# Patient Record
Sex: Male | Born: 1960 | Race: White | Hispanic: No | Marital: Married | Smoking: Never smoker
Health system: Southern US, Community
[De-identification: ages and names within clinical notes are randomized; demographics above are authoritative.]

## PROBLEM LIST (undated history)

## (undated) DIAGNOSIS — F419 Anxiety disorder, unspecified: Secondary | ICD-10-CM

## (undated) DIAGNOSIS — R569 Unspecified convulsions: Secondary | ICD-10-CM

## (undated) DIAGNOSIS — I10 Essential (primary) hypertension: Secondary | ICD-10-CM

## (undated) DIAGNOSIS — M1990 Unspecified osteoarthritis, unspecified site: Secondary | ICD-10-CM

---

## 2000-10-10 ENCOUNTER — Encounter: Payer: Self-pay | Admitting: Emergency Medicine

## 2000-10-10 ENCOUNTER — Emergency Department (HOSPITAL_COMMUNITY): Admission: EM | Admit: 2000-10-10 | Discharge: 2000-10-10 | Payer: Self-pay | Admitting: Emergency Medicine

## 2010-03-17 ENCOUNTER — Emergency Department (HOSPITAL_COMMUNITY): Admission: EM | Admit: 2010-03-17 | Discharge: 2010-03-17 | Payer: Self-pay | Admitting: Emergency Medicine

## 2011-01-06 LAB — CBC
HCT: 44.5 % (ref 39.0–52.0)
Hemoglobin: 15.6 g/dL (ref 13.0–17.0)
MCHC: 35 g/dL (ref 30.0–36.0)
MCV: 96.2 fL (ref 78.0–100.0)
Platelets: 214 10*3/uL (ref 150–400)
RBC: 4.63 MIL/uL (ref 4.22–5.81)
RDW: 13.4 % (ref 11.5–15.5)
WBC: 7 10*3/uL (ref 4.0–10.5)

## 2011-01-06 LAB — APTT: aPTT: 28 seconds (ref 24–37)

## 2011-01-06 LAB — POCT I-STAT, CHEM 8
BUN: 14 mg/dL (ref 6–23)
Sodium: 135 mEq/L (ref 135–145)
TCO2: 26 mmol/L (ref 0–100)

## 2011-01-06 LAB — CK TOTAL AND CKMB (NOT AT ARMC): Relative Index: 0.6 (ref 0.0–2.5)

## 2014-05-30 ENCOUNTER — Ambulatory Visit: Payer: Self-pay | Admitting: Podiatry

## 2015-03-07 ENCOUNTER — Emergency Department
Admission: EM | Admit: 2015-03-07 | Discharge: 2015-03-07 | Disposition: A | Discharge status: Home / Self Care | Payer: Worker's Compensation | Attending: Student | Admitting: Student

## 2015-03-07 ENCOUNTER — Encounter: Payer: Self-pay | Admitting: Emergency Medicine

## 2015-03-07 DIAGNOSIS — I10 Essential (primary) hypertension: Secondary | ICD-10-CM | POA: Diagnosis not present

## 2015-03-07 DIAGNOSIS — Y9289 Other specified places as the place of occurrence of the external cause: Secondary | ICD-10-CM | POA: Insufficient documentation

## 2015-03-07 DIAGNOSIS — W273XXA Contact with needle (sewing), initial encounter: Secondary | ICD-10-CM

## 2015-03-07 DIAGNOSIS — Y9389 Activity, other specified: Secondary | ICD-10-CM | POA: Diagnosis not present

## 2015-03-07 DIAGNOSIS — S61032A Puncture wound without foreign body of left thumb without damage to nail, initial encounter: Secondary | ICD-10-CM | POA: Diagnosis not present

## 2015-03-07 DIAGNOSIS — S61239A Puncture wound without foreign body of unspecified finger without damage to nail, initial encounter: Secondary | ICD-10-CM

## 2015-03-07 DIAGNOSIS — Y998 Other external cause status: Secondary | ICD-10-CM | POA: Diagnosis not present

## 2015-03-07 DIAGNOSIS — Z79899 Other long term (current) drug therapy: Secondary | ICD-10-CM | POA: Diagnosis not present

## 2015-03-07 DIAGNOSIS — W460XXA Contact with hypodermic needle, initial encounter: Secondary | ICD-10-CM | POA: Diagnosis not present

## 2015-03-07 DIAGNOSIS — Z7721 Contact with and (suspected) exposure to potentially hazardous body fluids: Secondary | ICD-10-CM | POA: Diagnosis present

## 2015-03-07 DIAGNOSIS — IMO0001 Reserved for inherently not codable concepts without codable children: Secondary | ICD-10-CM

## 2015-03-07 HISTORY — DX: Essential (primary) hypertension: I10

## 2015-03-07 HISTORY — DX: Unspecified convulsions: R56.9

## 2015-03-07 HISTORY — DX: Anxiety disorder, unspecified: F41.9

## 2015-03-07 NOTE — ED Notes (Signed)
States he was stuck during care of patient

## 2015-03-07 NOTE — ED Provider Notes (Signed)
[facility] Emergency Department Provider Note  ____________________________________________  Time seen: Approximately 1:03 PM  I have reviewed the triage vital signs and the nursing notes.   HISTORY  Chief Complaint Body Fluid Exposure    HPI Sean Berry is a 54 y.o. male history of hypertension, seizures, anxiety presents for post exposure examination after needle stick injury. 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 None autoinjector and he does not think any epinephrine was injected. He has otherwise been in his usual state of health. This occurred suddenly just prior to arrival. It is now resolved. Current severity is mild. No modifying factors. No associated symptoms.   Past Medical History  Diagnosis Date  . Hypertension   . Seizures   . Anxiety unk    There are no active problems to display for this patient.   History reviewed. No pertinent past surgical history.  Current Outpatient Rx  Name  Route  Sig  Dispense  Refill  . ALPRAZolam (XANAX) 1 MG tablet   Oral   Take 1 mg by mouth at bedtime as needed for anxiety.         . phenytoin (DILANTIN) 100 MG ER capsule   Oral   Take 500 mg by mouth daily.           Allergies Review of patient's allergies indicates no known allergies.  History reviewed. No pertinent family history.  Social History History  Substance Use Topics  . Smoking status: Not on file  . Smokeless tobacco: Not on file  . Alcohol Use: Not on file    Review of Systems Constitutional: No fever/chills Eyes: No visual changes. ENT: No sore throat. Cardiovascular: Denies chest pain. Respiratory: Denies shortness of breath. Gastrointestinal: No abdominal pain.  No nausea, no vomiting.  No diarrhea.  No constipation. Genitourinary: Negative for dysuria. Musculoskeletal: Negative for back  pain. Skin: Negative for rash. Neurological: Negative for headaches, focal weakness or numbness.  10-point ROS otherwise negative.  ____________________________________________   PHYSICAL EXAM:  VITAL SIGNS: ED Triage Vitals  Enc Vitals Group     BP 03/07/15 1247 133/97 mmHg     Pulse Rate 03/07/15 1247 77     Resp 03/07/15 1247 20     Temp 03/07/15 1247 98 F (36.7 C)     Temp Source 03/07/15 1247 Oral     SpO2 03/07/15 1247 96 %     Weight 03/07/15 1247 174 lb (78.926 kg)     Height 03/07/15 1247 5\' 7"  (1.702 m)     Head Cir --      Peak Flow --      Pain Score --      Pain Loc --      Pain Edu? --      Excl. in GC? --     Constitutional: Alert and oriented. Well appearing and in no acute distress. Eyes: Conjunctivae are normal. PERRL. EOMI. Head: Atraumatic. Nose: No congestion/rhinnorhea. Mouth/Throat: Mucous membranes are moist.  Oropharynx non-erythematous. Neck: No stridor.   Cardiovascular: Normal rate, regular rhythm. Grossly normal heart sounds.  Good peripheral circulation. Respiratory: Normal respiratory effort.  No retractions. Lungs CTAB. Gastrointestinal: Soft and nontender. No distention. No abdominal bruits. No CVA tenderness. Genitourinary: deferred Musculoskeletal: No lower extremity tenderness nor edema.  No joint effusions. All fingers of the left him are warm and well perfused, there is  a tiny needlestick mark in the radial aspect of the left thumb, brisk cap refill distally Neurologic:  Normal speech and language. No gross focal neurologic deficits are appreciated. Speech is normal. No gait instability. Skin:  Skin is warm, dry and intact. No rash noted. Psychiatric: Mood and affect are normal. Speech and behavior are normal.  ____________________________________________   LABS (all labs ordered are listed, but only abnormal results are displayed)  Labs Reviewed - No data to  display ____________________________________________  EKG  none ____________________________________________  RADIOLOGY  none ____________________________________________   PROCEDURES  Procedure(s) performed: None  Critical Care performed: No  ____________________________________________   INITIAL IMPRESSION / ASSESSMENT AND PLAN / ED COURSE  Pertinent labs & imaging results that were available during my care of the patient were reviewed by me and considered in my medical decision making (see chart for details).  Sean Eatonhurman M Knobel is a 54 y.o. male history of hypertension, seizures, anxiety presents for post exposure examination after needle stick injury. We will test source patient.  ----------------------------------------- 2:03 PM on 03/07/2015 -----------------------------------------  Received word from lab that rapid HIV of source patient is negative/nonreactive. Remaining labs pending. We'll discharge with instructions for follow-up. No post exposure prophylaxis indicated at this time. ____________________________________________   FINAL CLINICAL IMPRESSION(S) / ED DIAGNOSES  Final diagnoses:  Needle stick injury of finger of left hand, initial encounter      Sean DossEryka A Sariah Henkin, MD 03/07/15 1505

## 2015-03-07 NOTE — ED Notes (Signed)
Awaiting test results   Denies any c/o's at present

## 2015-08-08 ENCOUNTER — Ambulatory Visit: Payer: Self-pay | Admitting: Physician Assistant

## 2015-08-08 ENCOUNTER — Encounter: Payer: Self-pay | Admitting: Physician Assistant

## 2015-08-08 VITALS — BP 110/79 | HR 64 | Temp 98.2°F

## 2015-08-08 DIAGNOSIS — S61319A Laceration without foreign body of unspecified finger with damage to nail, initial encounter: Secondary | ICD-10-CM

## 2015-08-08 MED ORDER — LIDOCAINE HCL (PF) 1 % IJ SOLN
5.0000 mL | Freq: Once | INTRAMUSCULAR | Status: AC
  Administered 2015-08-08: 5 mL via INTRADERMAL

## 2015-08-08 MED ORDER — CEPHALEXIN 500 MG PO CAPS: 500.0000 mg | ORAL_CAPSULE | Freq: Three times a day (TID) | ORAL | Status: DC

## 2015-08-08 NOTE — Progress Notes (Signed)
S: c/o laceration across tip of finger last pm around 1 am, was lifting his bed to put a paver underneath, bed slipped and landed on tip of finger, doesn't think its broken but end of nail needs to be clipped off, last tdap is less than 5 years, remainder ros neg  O: vitals wnl, nad, r middle finger with laceation across tip of finger and nail bed, full rom, no bony tenderness, n/v intact, 1% xylocaine digital block, soaked finger in saline and betadine, clipped nail off that was avulsed, used vaseline gauze and dressing Pt tolerated procedure well  A: laceration /avulsion to finger nail  P: keflex 500mg  tid x 7d, keep area clean and dry, no work Friday if area still painful or bleeding

## 2015-08-08 NOTE — Patient Instructions (Signed)
Nail Bed Laceration Nail bed lacerations are cuts or breaks in the soft tissue under a fingernail or toenail. These injuries are usually painful and cause bleeding. They often involve damage to the nail or loss of the nail. If the nail remains in place, a nail bed laceration may result in a painful collection of blood under the nail (hematoma).  CAUSES Nail bed lacerations are commonly caused by:   Crush or pinch injuries in which the finger or toe gets caught between two objects.  A sharp cut to the fingertip or toe.  Tearing injuries (avulsions) to the tip of the finger or toe. DIAGNOSIS Your health care provider will take a medical history, ask for details about how the injury occurred, and examine the injured area. The wound will be cleaned so that the health care provider can see the extent of the injury. X-rays are sometimes done to check for broken bones. TREATMENT Treatment will depend on the severity of the laceration and the details of the injury. Most lacerations require repair with stitches (sutures). For this repair, your health care provider may:  Give you medicine to numb the injured area (local anesthetic). In some cases, the health care provider may also apply a compression bandage (tourniquet) to reduce bleeding.  Remove the nail from the nail bed.  Close the wound in the nail bed with sutures that dissolve on their own (absorbable sutures).  Reattach the nail (if intact) after repairing the nail bed. The nail may be held in place with sutures or skin glue. Sometimes, a small hole is made in the nail to allow for normal drainage of fluid or blood. If the nail is lost or too damaged, a small piece of silicone or special gauze may be put over the nail bed.  A bandage (dressing) may be applied over the wound. A splint is sometimes used for increased protection. You may need a tetanus shot if:  You cannot remember when you had your last tetanus shot.   You have never had a  tetanus shot.   The injury broke your skin. If you get a tetanus shot, your arm may swell, get red, and feel warm to the touch. This is common and not a problem. If you need a tetanus shot and you choose not to have one, there is a rare chance of getting tetanus. Sickness from tetanus can be serious. HOME CARE INSTRUCTIONS  Keep the wound clean and dry. Change or remove dressings only as directed by your health care provider. If the nail was preserved, you may be asked not to change the original dressing for 5 to 7 days. If the nail was lost, you may need to change dressings much sooner.   Gently clean the wound with soap and water before putting on a new dressing.   Apply antibiotic ointment to the wound if advised to do so by your health care provider.   Move your hand or foot normally to prevent stiffness. Your health care provider may give you specific instructions.   Only take over-the-counter or prescription medicines as directed by your health care provider. If you were prescribed antibiotics, take them as directed. Finish them even if you start to feel better.  Follow up with your health care provider as directed. SEEK MEDICAL CARE IF:  You have redness, swelling, or increasing pain in the injured area.   You notice fluid draining from the injured site.  SEEK IMMEDIATE MEDICAL CARE IF:  Your wound splits open   and starts bleeding.   Your skin around the injury is turning dark blue or black.  MAKE SURE YOU:  Understand these instructions.   Will watch your condition.   Will get help right away if you are not doing well or get worse.    This information is not intended to replace advice given to you by your health care provider. Make sure you discuss any questions you have with your health care provider.   Document Released: 01/13/2008 Document Revised: 06/08/2013 Document Reviewed: 03/21/2013 Elsevier Interactive Patient Education 2016 [employer]               [place]  [facility]   OCCUPATIONAL HEALTH PHYSICIAN ASSISTANT [facility]IN[facility] HEALTH RECOMMENDATIONS  NAME:Sean Berry, Sean Berry DEPARTMENT:  SCREEN REPORT: Based on this screening exam, there is or is not a detected condition which would place the examinee, fellow employees, or patients at increased risk of physical impairment from his/her work duties  DIAGNOSIS: laceration to finger  NO RECOMMENDATION/RESTSRICTIONS RECOMMENDATIONS/RESTSRICTIONS LISTED BELOW, no work Friday     SIGNATURE:___________________________________________ DATE:________

## 2017-01-23 ENCOUNTER — Other Ambulatory Visit
Admission: RE | Admit: 2017-01-23 | Discharge: 2017-01-23 | Disposition: A | Discharge status: Home / Self Care | Payer: Self-pay | Attending: Family Medicine | Admitting: Family Medicine

## 2017-08-14 NOTE — H&P (Signed)
TOTAL KNEE ADMISSION H&P  Patient is being admitted for right total knee arthroplasty with retained hardware.  Subjective:  Chief Complaint:     Right knee primary OA / pain with removal of hardware  HPI: Sean Berry, 56 y.o. male, has a history of pain and functional disability in the right knee due to arthritis and retained hardware and has failed non-surgical conservative treatments for greater than 12 weeks to include NSAID's and/or analgesics, corticosteriod injections and activity modification.  Onset of symptoms was gradual, starting >10 years ago with gradually worsening course since that time. The patient noted prior procedures on the knee to include  arthroscopy and ACL reconstruction on the right knee(s).  Patient currently rates pain in the right knee(s) at 10 out of 10 with activity. Patient has night pain, worsening of pain with activity and weight bearing, pain that interferes with activities of daily living, pain with passive range of motion, crepitus and joint swelling.  Patient has evidence of periarticular osteophytes and joint space narrowing by imaging studies.  There is no active infection.   Risks, benefits and expectations were discussed with the patient.  Risks including but not limited to the risk of anesthesia, blood clots, nerve damage, blood vessel damage, failure of the prosthesis, infection and up to and including death.  Patient understand the risks, benefits and expectations and wishes to proceed with surgery.   PCP: No primary care provider on file.  D/C Plans:       Home  Post-op Meds:       No Rx given   Tranexamic Acid:      To be given - IV   Decadron:      Is to be given  FYI:     ASA  Oxycodone (doesn't need Rx for home, will obtain from PCP)  DME:   Rx given for - RW and 3-n-1  PT:   OPPT Rx given    Past Medical History:  Diagnosis Date  . Anxiety unk  . Hypertension   . Seizures (HCC)     No past surgical history on file.  No current  facility-administered medications for this encounter.    Current Outpatient Prescriptions  Medication Sig Dispense Refill Last Dose  . ALPRAZolam (XANAX) 1 MG tablet Take 1 mg by mouth at bedtime as needed for anxiety.     Marland Kitchen. esomeprazole (NEXIUM) 40 MG capsule Take 40 mg by mouth daily.   11   . Oxycodone HCl 20 MG TABS TAKE 1 TABLET BY MOUTH UP TO 4 TIMES A DAY AS NEEDED FOR PAIN  0   . phenytoin (DILANTIN) 100 MG ER capsule Take 500 mg by mouth daily.     . tadalafil (CIALIS) 20 MG tablet TAKE 1/2 OF A TABLET BY MOUTH AS NEEDED FOR E.D.  11   . cephALEXin (KEFLEX) 500 MG capsule Take 1 capsule (500 mg total) by mouth 3 (three) times daily. (Patient not taking: Reported on 08/13/2017) 21 capsule 0 Not Taking at Unknown time   Allergies  Allergen Reactions  . Wellbutrin [Bupropion]     Social History  Substance Use Topics  . Smoking status: Never Smoker  . Smokeless tobacco: Not on file  . Alcohol use Not on file       Review of Systems  Constitutional: Negative.   HENT: Negative.   Eyes: Negative.   Respiratory: Negative.   Cardiovascular: Negative.   Gastrointestinal: Negative.   Genitourinary: Negative.   Musculoskeletal: Positive for  joint pain.  Skin: Negative.   Neurological: Negative.   Endo/Heme/Allergies: Negative.   Psychiatric/Behavioral: The patient is nervous/anxious.     Objective:  Physical Exam  Constitutional: He is oriented to person, place, and time. He appears well-developed.  HENT:  Head: Normocephalic.  Eyes: Pupils are equal, round, and reactive to light.  Neck: Neck supple. No JVD present. No tracheal deviation present. No thyromegaly present.  Cardiovascular: Normal rate, regular rhythm and intact distal pulses.   Respiratory: Effort normal and breath sounds normal. No respiratory distress. He has no wheezes.  GI: Soft. There is no tenderness. There is no guarding.  Musculoskeletal:       Right knee: He exhibits decreased range of motion,  swelling and bony tenderness. He exhibits no ecchymosis, no deformity, no laceration and no erythema. Tenderness found.  Lymphadenopathy:    He has no cervical adenopathy.  Neurological: He is alert and oriented to person, place, and time.  Skin: Skin is warm and dry.  Psychiatric: He has a normal mood and affect.      Labs:  Estimated body mass index is 27.25 kg/m as calculated from the following:   Height as of 03/07/15: 5\' 7"  (1.702 m).   Weight as of 03/07/15: 78.9 kg (174 lb).   Imaging Review Plain radiographs demonstrate severe degenerative joint disease of the right knee(s). The overall alignment is neutral. The bone quality appears to be good for age and reported activity level.  Assessment/Plan:  End stage arthritis, right knee   The patient history, physical examination, clinical judgment of the provider and imaging studies are consistent with end stage degenerative joint disease of the right knee(s) and total knee arthroplasty is deemed medically necessary. The treatment options including medical management, injection therapy arthroscopy and arthroplasty were discussed at length. The risks and benefits of total knee arthroplasty were presented and reviewed. The risks due to aseptic loosening, infection, stiffness, patella tracking problems, thromboembolic complications and other imponderables were discussed. The patient acknowledged the explanation, agreed to proceed with the plan and consent was signed. Patient is being admitted for inpatient treatment for surgery, pain control, PT, OT, prophylactic antibiotics, VTE prophylaxis, progressive ambulation and ADL's and discharge planning. The patient is planning to be discharged home.      Anastasio Auerbach Kirstein Baxley   PA-C  08/14/2017, 10:02 AM

## 2017-08-18 ENCOUNTER — Other Ambulatory Visit (HOSPITAL_COMMUNITY): Payer: Self-pay | Admitting: Emergency Medicine

## 2017-08-18 NOTE — Patient Instructions (Addendum)
Sean Berry  08/18/2017   Your procedure is scheduled on: 08-25-17  [facility]    Report to admitting at 7:30AM   Call this number if you have problems the morning of surgery [phone]   Remember: ONLY 1 PERSON MAY GO WITH YOU TO SHORT STAY TO GET  READY MORNING OF YOUR SURGERY.  Do not eat food or drink liquids :After Midnight.     Take these medicines the morning of surgery with A SIP OF WATER: esomeprazole(nexium), phenytoin(dilantin), oxycodone if needed                                You may not have any metal on your body including hair pins and              piercings  Do not wear jewelry, make-up, lotions, powders or perfumes, deodorant              Men may shave face and neck.   Do not bring valuables to the hospital. [facility] IS NOT             RESPONSIBLE   FOR VALUABLES.  Contacts, dentures or bridgework may not be worn into surgery.  Leave suitcase in the car. After surgery it may be brought to your room.                Please read over the following fact sheets you were given: _____________________________________________________________________            [facility] Pre[facility] Before surgery, you can play an important role.  Because skin is not sterile, your skin needs to be as free of germs as possible.  You can reduce the number of germs on your skin by washing with CHG (chlorahexidine gluconate) soap before surgery.  CHG is an antiseptic cleaner which kills germs and bonds with the skin to continue killing germs even after washing. Please DO NOT use if you have an allergy to CHG or antibacterial soaps.  If your skin becomes reddened/irritated stop using the CHG and inform your nurse when you arrive at Short Stay. Do not shave (including legs and underarms) for at least 48 hours prior to the first CHG shower.  You may shave your face/neck. Please follow these instructions carefully:  1.   Shower with CHG Soap the night before surgery and the  morning of Surgery.  2.  If you choose to wash your hair, wash your hair first as usual with your  normal  shampoo.  3.  After you shampoo, rinse your hair and body thoroughly to remove the  shampoo.                           4.  Use CHG as you would any other liquid soap.  You can apply chg directly  to the skin and wash                       Gently with a scrungie or clean washcloth.  5.  Apply the CHG Soap to your body ONLY FROM THE NECK DOWN.   Do not use on face/ open  Wound or open sores. Avoid contact with eyes, ears mouth and genitals (private parts).                       Wash face,  Genitals (private parts) with your normal soap.             6.  Wash thoroughly, paying special attention to the area where your surgery  will be performed.  7.  Thoroughly rinse your body with warm water from the neck down.  8.  DO NOT shower/wash with your normal soap after using and rinsing off  the CHG Soap.                9.  Pat yourself dry with a clean towel.            10.  Wear clean pajamas.            11.  Place clean sheets on your bed the night of your first shower and do not  sleep with pets. Day of Surgery : Do not apply any lotions/deodorants the morning of surgery.  Please wear clean clothes to the hospital/surgery center.  FAILURE TO FOLLOW THESE INSTRUCTIONS MAY RESULT IN THE CANCELLATION OF YOUR SURGERY PATIENT SIGNATURE_________________________________  NURSE SIGNATURE__________________________________  ________________________________________________________________________   Adam Phenix  An incentive spirometer is a tool that can help keep your lungs clear and active. This tool measures how well you are filling your lungs with each breath. Taking long deep breaths may help reverse or decrease the chance of developing breathing (pulmonary) problems (especially infection) following:  A long  period of time when you are unable to move or be active. BEFORE THE PROCEDURE   If the spirometer includes an indicator to show your best effort, your nurse or respiratory therapist will set it to a desired goal.  If possible, sit up straight or lean slightly forward. Try not to slouch.  Hold the incentive spirometer in an upright position. INSTRUCTIONS FOR USE  1. Sit on the edge of your bed if possible, or sit up as far as you can in bed or on a chair. 2. Hold the incentive spirometer in an upright position. 3. Breathe out normally. 4. Place the mouthpiece in your mouth and seal your lips tightly around it. 5. Breathe in slowly and as deeply as possible, raising the piston or the ball toward the top of the column. 6. Hold your breath for 3-5 seconds or for as long as possible. Allow the piston or ball to fall to the bottom of the column. 7. Remove the mouthpiece from your mouth and breathe out normally. 8. Rest for a few seconds and repeat Steps 1 through 7 at least 10 times every 1-2 hours when you are awake. Take your time and take a few normal breaths between deep breaths. 9. The spirometer may include an indicator to show your best effort. Use the indicator as a goal to work toward during each repetition. 10. After each set of 10 deep breaths, practice coughing to be sure your lungs are clear. If you have an incision (the cut made at the time of surgery), support your incision when coughing by placing a pillow or rolled up towels firmly against it. Once you are able to get out of bed, walk around indoors and cough well. You may stop using the incentive spirometer when instructed by your caregiver.  RISKS AND COMPLICATIONS  Take your time so you do not get  dizzy or light-headed.  If you are in pain, you may need to take or ask for pain medication before doing incentive spirometry. It is harder to take a deep breath if you are having pain. AFTER USE  Rest and breathe slowly and  easily.  It can be helpful to keep track of a log of your progress. Your caregiver can provide you with a simple table to help with this. If you are using the spirometer at home, follow these instructions: [place] IF:   You are having difficultly using the spirometer.  You have trouble using the spirometer as often as instructed.  Your pain medication is not giving enough relief while using the spirometer.  You develop fever of 100.5 F (38.1 C) or higher. SEEK IMMEDIATE MEDICAL CARE IF:   You cough up bloody sputum that had not been present before.  You develop fever of 102 F (38.9 C) or greater.  You develop worsening pain at or near the incision site. MAKE SURE YOU:   Understand these instructions.  Will watch your condition.  Will get help right away if you are not doing well or get worse. Document Released: 02/16/2007 Document Revised: 12/29/2011 Document Reviewed: 04/19/2007 ExitCare Patient Information 2014 ExitCare, [place].   ________________________________________________________________________  WHAT IS A BLOOD TRANSFUSION? Blood Transfusion Information  A transfusion is the replacement of blood or some of its parts. Blood is made up of multiple cells which provide different functions.  Red blood cells carry oxygen and are used for blood loss replacement.  White blood cells fight against infection.  Platelets control bleeding.  Plasma helps clot blood.  Other blood products are available for specialized needs, such as hemophilia or other clotting disorders. BEFORE THE TRANSFUSION  Who gives blood for transfusions?   Healthy volunteers who are fully evaluated to make sure their blood is safe. This is blood bank blood. Transfusion therapy is the safest it has ever been in the practice of medicine. Before blood is taken from a donor, a complete history is taken to make sure that person has no history of diseases nor engages in risky social  behavior (examples are intravenous drug use or sexual activity with multiple partners). The donor's travel history is screened to minimize risk of transmitting infections, such as malaria. The donated blood is tested for signs of infectious diseases, such as HIV and hepatitis. The blood is then tested to be sure it is compatible with you in order to minimize the chance of a transfusion reaction. If you or a relative donates blood, this is often done in anticipation of surgery and is not appropriate for emergency situations. It takes many days to process the donated blood. RISKS AND COMPLICATIONS Although transfusion therapy is very safe and saves many lives, the main dangers of transfusion include:   Getting an infectious disease.  Developing a transfusion reaction. This is an allergic reaction to something in the blood you were given. Every precaution is taken to prevent this. The decision to have a blood transfusion has been considered carefully by your caregiver before blood is given. Blood is not given unless the benefits outweigh the risks. AFTER THE TRANSFUSION  Right after receiving a blood transfusion, you will usually feel much better and more energetic. This is especially true if your red blood cells have gotten low (anemic). The transfusion raises the level of the red blood cells which carry oxygen, and this usually causes an energy increase.  The nurse administering the transfusion will  monitor you carefully for complications. HOME CARE INSTRUCTIONS  No special instructions are needed after a transfusion. You may find your energy is better. Speak with your caregiver about any limitations on activity for underlying diseases you may have. SEEK MEDICAL CARE IF:   Your condition is not improving after your transfusion.  You develop redness or irritation at the intravenous (IV) site. SEEK IMMEDIATE MEDICAL CARE IF:  Any of the following symptoms occur over the next 12 hours:  Shaking  chills.  You have a temperature by mouth above 102 F (38.9 C), not controlled by medicine.  Chest, back, or muscle pain.  People around you feel you are not acting correctly or are confused.  Shortness of breath or difficulty breathing.  Dizziness and fainting.  You get a rash or develop hives.  You have a decrease in urine output.  Your urine turns a dark color or changes to pink, red, or brown. Any of the following symptoms occur over the next 10 days:  You have a temperature by mouth above 102 F (38.9 C), not controlled by medicine.  Shortness of breath.  Weakness after normal activity.  The white part of the eye turns yellow (jaundice).  You have a decrease in the amount of urine or are urinating less often.  Your urine turns a dark color or changes to pink, red, or brown. Document Released: 10/03/2000 Document Revised: 12/29/2011 Document Reviewed: 05/22/2008 [facility] Patient Information 2014 [place], [place].  _______________________________________________________________________

## 2017-08-19 ENCOUNTER — Encounter (HOSPITAL_COMMUNITY): Payer: Self-pay

## 2017-08-19 ENCOUNTER — Encounter (HOSPITAL_COMMUNITY)
Admission: RE | Admit: 2017-08-19 | Discharge: 2017-08-19 | Disposition: A | Discharge status: Home / Self Care | Payer: Managed Care, Other (non HMO) | Source: Ambulatory Visit | Attending: Orthopedic Surgery | Admitting: Orthopedic Surgery

## 2017-08-19 DIAGNOSIS — M1711 Unilateral primary osteoarthritis, right knee: Secondary | ICD-10-CM | POA: Diagnosis not present

## 2017-08-19 DIAGNOSIS — Z01812 Encounter for preprocedural laboratory examination: Secondary | ICD-10-CM | POA: Diagnosis present

## 2017-08-19 DIAGNOSIS — Z01810 Encounter for preprocedural cardiovascular examination: Secondary | ICD-10-CM | POA: Diagnosis present

## 2017-08-19 DIAGNOSIS — I10 Essential (primary) hypertension: Secondary | ICD-10-CM | POA: Insufficient documentation

## 2017-08-19 HISTORY — DX: Unspecified osteoarthritis, unspecified site: M19.90

## 2017-08-19 LAB — SURGICAL PCR SCREEN
MRSA, PCR: POSITIVE — AB
Staphylococcus aureus: POSITIVE — AB

## 2017-08-19 LAB — BASIC METABOLIC PANEL
ANION GAP: 8 (ref 5–15)
BUN: 14 mg/dL (ref 6–20)
CHLORIDE: 105 mmol/L (ref 101–111)
CO2: 25 mmol/L (ref 22–32)
Calcium: 8.9 mg/dL (ref 8.9–10.3)
Creatinine, Ser: 0.84 mg/dL (ref 0.61–1.24)
GFR calc Af Amer: >60 mL/min (ref >60)
GLUCOSE: 96 mg/dL (ref 65–99)
POTASSIUM: 4.2 mmol/L (ref 3.5–5.1)
Sodium: 138 mmol/L (ref 135–145)

## 2017-08-19 LAB — CBC
HEMATOCRIT: 41.3 % (ref 39.0–52.0)
HEMOGLOBIN: 14.2 g/dL (ref 13.0–17.0)
MCH: 31 pg (ref 26.0–34.0)
MCHC: 34.4 g/dL (ref 30.0–36.0)
MCV: 90.2 fL (ref 78.0–100.0)
PLATELETS: 224 10*3/uL (ref 150–400)
RBC: 4.58 MIL/uL (ref 4.22–5.81)
RDW: 13.3 % (ref 11.5–15.5)
WBC: 4.9 10*3/uL (ref 4.0–10.5)

## 2017-08-19 LAB — ABO/RH: ABO/RH(D): O NEG

## 2017-08-24 NOTE — Progress Notes (Signed)
Paged Dr Charlann Boxerlin regarding PCR+ MRSA, Scheduled surgery Tomorrow.

## 2017-08-25 ENCOUNTER — Encounter (HOSPITAL_COMMUNITY)
Admission: RE | Disposition: A | Discharge status: Home / Self Care | Payer: Self-pay | Source: Ambulatory Visit | Attending: Orthopedic Surgery

## 2017-08-25 ENCOUNTER — Encounter (HOSPITAL_COMMUNITY): Payer: Self-pay

## 2017-08-25 ENCOUNTER — Inpatient Hospital Stay (HOSPITAL_COMMUNITY): Payer: Managed Care, Other (non HMO) | Admitting: Registered Nurse

## 2017-08-25 ENCOUNTER — Observation Stay (HOSPITAL_COMMUNITY)
Admission: RE | Admit: 2017-08-25 | Discharge: 2017-08-26 | Disposition: A | Discharge status: Home / Self Care | Payer: Managed Care, Other (non HMO) | Source: Ambulatory Visit | Attending: Orthopedic Surgery | Admitting: Orthopedic Surgery

## 2017-08-25 ENCOUNTER — Other Ambulatory Visit: Payer: Self-pay

## 2017-08-25 DIAGNOSIS — Z79899 Other long term (current) drug therapy: Secondary | ICD-10-CM | POA: Diagnosis not present

## 2017-08-25 DIAGNOSIS — M25761 Osteophyte, right knee: Secondary | ICD-10-CM | POA: Insufficient documentation

## 2017-08-25 DIAGNOSIS — M1711 Unilateral primary osteoarthritis, right knee: Principal | ICD-10-CM | POA: Insufficient documentation

## 2017-08-25 DIAGNOSIS — R262 Difficulty in walking, not elsewhere classified: Secondary | ICD-10-CM | POA: Diagnosis not present

## 2017-08-25 DIAGNOSIS — Z472 Encounter for removal of internal fixation device: Secondary | ICD-10-CM | POA: Diagnosis not present

## 2017-08-25 DIAGNOSIS — E663 Overweight: Secondary | ICD-10-CM | POA: Diagnosis not present

## 2017-08-25 DIAGNOSIS — Z6825 Body mass index (BMI) 25.0-25.9, adult: Secondary | ICD-10-CM | POA: Insufficient documentation

## 2017-08-25 DIAGNOSIS — R569 Unspecified convulsions: Secondary | ICD-10-CM | POA: Insufficient documentation

## 2017-08-25 DIAGNOSIS — F419 Anxiety disorder, unspecified: Secondary | ICD-10-CM | POA: Diagnosis not present

## 2017-08-25 DIAGNOSIS — Z96659 Presence of unspecified artificial knee joint: Secondary | ICD-10-CM

## 2017-08-25 HISTORY — PX: TOTAL KNEE ARTHROPLASTY: SHX125

## 2017-08-25 LAB — TYPE AND SCREEN
ABO/RH(D): O NEG
Antibody Screen: NEGATIVE

## 2017-08-25 SURGERY — ARTHROPLASTY, KNEE, TOTAL
Anesthesia: General | Site: Knee | Laterality: Right

## 2017-08-25 MED ORDER — KETOROLAC TROMETHAMINE 30 MG/ML IJ SOLN
INTRAMUSCULAR | Status: DC | PRN
  Administered 2017-08-25: 30 mg

## 2017-08-25 MED ORDER — ONDANSETRON HCL 4 MG/2ML IJ SOLN: 4.0000 mg | Freq: Four times a day (QID) | INTRAMUSCULAR | Status: DC | PRN

## 2017-08-25 MED ORDER — CHLORHEXIDINE GLUCONATE CLOTH 2 % EX PADS: 6.0000 | MEDICATED_PAD | Freq: Every day | CUTANEOUS | Status: DC

## 2017-08-25 MED ORDER — VANCOMYCIN HCL IN DEXTROSE 1-5 GM/200ML-% IV SOLN
1000.0000 mg | Freq: Once | INTRAVENOUS | Status: AC
  Administered 2017-08-25: 1000 mg via INTRAVENOUS

## 2017-08-25 MED ORDER — FENTANYL CITRATE (PF) 100 MCG/2ML IJ SOLN: 50.0000 ug | INTRAMUSCULAR | Status: DC | PRN

## 2017-08-25 MED ORDER — MUPIROCIN 2 % EX OINT
1.0000 "application " | TOPICAL_OINTMENT | Freq: Two times a day (BID) | CUTANEOUS | Status: DC
  Administered 2017-08-25 – 2017-08-26 (×2): 1 via NASAL
  Filled 2017-08-25: qty 22

## 2017-08-25 MED ORDER — ASPIRIN 81 MG PO CHEW
81.0000 mg | CHEWABLE_TABLET | Freq: Two times a day (BID) | ORAL | Status: DC
  Administered 2017-08-25 – 2017-08-26 (×2): 81 mg via ORAL
  Filled 2017-08-25 (×2): qty 1

## 2017-08-25 MED ORDER — METOCLOPRAMIDE HCL 5 MG PO TABS: 5.0000 mg | ORAL_TABLET | Freq: Three times a day (TID) | ORAL | Status: DC | PRN

## 2017-08-25 MED ORDER — ONDANSETRON HCL 4 MG/2ML IJ SOLN
INTRAMUSCULAR | Status: AC
  Filled 2017-08-25: qty 2

## 2017-08-25 MED ORDER — OXYCODONE HCL 5 MG PO TABS
10.0000 mg | ORAL_TABLET | ORAL | Status: DC | PRN
  Administered 2017-08-25 – 2017-08-26 (×2): 10 mg via ORAL
  Filled 2017-08-25: qty 2

## 2017-08-25 MED ORDER — FENTANYL CITRATE (PF) 100 MCG/2ML IJ SOLN
INTRAMUSCULAR | Status: AC
  Administered 2017-08-25: 50 ug via INTRAVENOUS
  Filled 2017-08-25: qty 2

## 2017-08-25 MED ORDER — NON FORMULARY: 40.0000 mg | Freq: Every day | Status: DC

## 2017-08-25 MED ORDER — PHENYTOIN SODIUM EXTENDED 100 MG PO CAPS: 400.0000 mg | ORAL_CAPSULE | Freq: Every day | ORAL | Status: DC

## 2017-08-25 MED ORDER — SUGAMMADEX SODIUM 200 MG/2ML IV SOLN
INTRAVENOUS | Status: AC
  Filled 2017-08-25: qty 2

## 2017-08-25 MED ORDER — PHENYTOIN SODIUM EXTENDED 100 MG PO CAPS
300.0000 mg | ORAL_CAPSULE | Freq: Every day | ORAL | Status: DC
  Filled 2017-08-25: qty 3

## 2017-08-25 MED ORDER — FENTANYL CITRATE (PF) 250 MCG/5ML IJ SOLN
INTRAMUSCULAR | Status: AC
  Filled 2017-08-25: qty 5

## 2017-08-25 MED ORDER — OXYCODONE HCL 5 MG PO TABS
20.0000 mg | ORAL_TABLET | ORAL | Status: DC | PRN
  Administered 2017-08-25: 20:00:00 20 mg via ORAL
  Administered 2017-08-25: 10 mg via ORAL
  Administered 2017-08-26 (×3): 20 mg via ORAL
  Filled 2017-08-25 (×5): qty 4

## 2017-08-25 MED ORDER — VANCOMYCIN HCL IN DEXTROSE 1-5 GM/200ML-% IV SOLN
INTRAVENOUS | Status: AC
  Filled 2017-08-25: qty 200

## 2017-08-25 MED ORDER — CHLORHEXIDINE GLUCONATE 4 % EX LIQD: 60.0000 mL | Freq: Once | CUTANEOUS | Status: DC

## 2017-08-25 MED ORDER — BUPIVACAINE-EPINEPHRINE (PF) 0.25% -1:200000 IJ SOLN
INTRAMUSCULAR | Status: DC | PRN
  Administered 2017-08-25: 30 mL

## 2017-08-25 MED ORDER — SODIUM CHLORIDE 0.9 % IR SOLN
Status: DC | PRN
  Administered 2017-08-25: 1000 mL

## 2017-08-25 MED ORDER — ROCURONIUM BROMIDE 100 MG/10ML IV SOLN
INTRAVENOUS | Status: DC | PRN
  Administered 2017-08-25: 50 mg via INTRAVENOUS

## 2017-08-25 MED ORDER — DEXAMETHASONE SODIUM PHOSPHATE 10 MG/ML IJ SOLN
INTRAMUSCULAR | Status: AC
  Filled 2017-08-25: qty 1

## 2017-08-25 MED ORDER — TRANEXAMIC ACID 1000 MG/10ML IV SOLN
1000.0000 mg | INTRAVENOUS | Status: AC
  Administered 2017-08-25: 1000 mg via INTRAVENOUS
  Filled 2017-08-25: qty 1100

## 2017-08-25 MED ORDER — OXYCODONE HCL 5 MG PO TABS
ORAL_TABLET | ORAL | Status: AC
  Filled 2017-08-25: qty 2

## 2017-08-25 MED ORDER — FENTANYL CITRATE (PF) 100 MCG/2ML IJ SOLN
INTRAMUSCULAR | Status: AC
  Filled 2017-08-25: qty 2

## 2017-08-25 MED ORDER — CELECOXIB 200 MG PO CAPS
200.0000 mg | ORAL_CAPSULE | Freq: Two times a day (BID) | ORAL | Status: DC
  Administered 2017-08-25 – 2017-08-26 (×2): 200 mg via ORAL
  Filled 2017-08-25 (×2): qty 1

## 2017-08-25 MED ORDER — PROPOFOL 10 MG/ML IV BOLUS
INTRAVENOUS | Status: AC
  Filled 2017-08-25: qty 60

## 2017-08-25 MED ORDER — METHOCARBAMOL 500 MG PO TABS
500.0000 mg | ORAL_TABLET | Freq: Four times a day (QID) | ORAL | Status: DC | PRN
  Administered 2017-08-25 – 2017-08-26 (×3): 500 mg via ORAL
  Filled 2017-08-25 (×3): qty 1

## 2017-08-25 MED ORDER — SUGAMMADEX SODIUM 200 MG/2ML IV SOLN
INTRAVENOUS | Status: DC | PRN
  Administered 2017-08-25: 150 mg via INTRAVENOUS

## 2017-08-25 MED ORDER — OXYCODONE HCL 5 MG PO TABS
ORAL_TABLET | ORAL | Status: AC
  Administered 2017-08-25: 10 mg via ORAL
  Filled 2017-08-25: qty 2

## 2017-08-25 MED ORDER — MAGNESIUM CITRATE PO SOLN: 1.0000 | Freq: Once | ORAL | Status: DC | PRN

## 2017-08-25 MED ORDER — FENTANYL CITRATE (PF) 100 MCG/2ML IJ SOLN
INTRAMUSCULAR | Status: DC | PRN
  Administered 2017-08-25 (×3): 100 ug via INTRAVENOUS
  Administered 2017-08-25: 50 ug via INTRAVENOUS

## 2017-08-25 MED ORDER — METOCLOPRAMIDE HCL 5 MG/ML IJ SOLN: 5.0000 mg | Freq: Three times a day (TID) | INTRAMUSCULAR | Status: DC | PRN

## 2017-08-25 MED ORDER — FERROUS SULFATE 325 (65 FE) MG PO TABS
325.0000 mg | ORAL_TABLET | Freq: Three times a day (TID) | ORAL | Status: DC
  Administered 2017-08-26 (×2): 325 mg via ORAL
  Filled 2017-08-25 (×2): qty 1

## 2017-08-25 MED ORDER — SODIUM CHLORIDE 0.9 % IV SOLN
INTRAVENOUS | Status: DC
  Administered 2017-08-25: 18:00:00 via INTRAVENOUS

## 2017-08-25 MED ORDER — PHENOL 1.4 % MT LIQD
1.0000 | OROMUCOSAL | Status: DC | PRN
Start: 2017-08-25 — End: 2017-08-26
  Filled 2017-08-25: qty 177

## 2017-08-25 MED ORDER — MEPERIDINE HCL 50 MG/ML IJ SOLN: 6.2500 mg | INTRAMUSCULAR | Status: DC | PRN

## 2017-08-25 MED ORDER — DEXAMETHASONE SODIUM PHOSPHATE 10 MG/ML IJ SOLN
10.0000 mg | Freq: Once | INTRAMUSCULAR | Status: AC
  Administered 2017-08-25: 10 mg via INTRAVENOUS

## 2017-08-25 MED ORDER — FENTANYL CITRATE (PF) 100 MCG/2ML IJ SOLN
25.0000 ug | INTRAMUSCULAR | Status: DC | PRN
  Administered 2017-08-25 (×2): 50 ug via INTRAVENOUS

## 2017-08-25 MED ORDER — STERILE WATER FOR IRRIGATION IR SOLN
Status: DC | PRN
  Administered 2017-08-25: 3000 mL

## 2017-08-25 MED ORDER — POLYETHYLENE GLYCOL 3350 17 G PO PACK: 17.0000 g | PACK | Freq: Two times a day (BID) | ORAL | 0 refills | Status: AC

## 2017-08-25 MED ORDER — ROPIVACAINE HCL 7.5 MG/ML IJ SOLN
INTRAMUSCULAR | Status: DC | PRN
  Administered 2017-08-25: 20 mL via PERINEURAL

## 2017-08-25 MED ORDER — ACETAMINOPHEN 650 MG RE SUPP: 650.0000 mg | RECTAL | Status: DC | PRN

## 2017-08-25 MED ORDER — PROMETHAZINE HCL 25 MG/ML IJ SOLN: 6.2500 mg | INTRAMUSCULAR | Status: DC | PRN

## 2017-08-25 MED ORDER — DIPHENHYDRAMINE HCL 12.5 MG/5ML PO ELIX: 12.5000 mg | ORAL_SOLUTION | ORAL | Status: DC | PRN

## 2017-08-25 MED ORDER — DEXAMETHASONE SODIUM PHOSPHATE 10 MG/ML IJ SOLN
10.0000 mg | Freq: Once | INTRAMUSCULAR | Status: DC
  Filled 2017-08-25: qty 1

## 2017-08-25 MED ORDER — ALPRAZOLAM 1 MG PO TABS: 1.0000 mg | ORAL_TABLET | Freq: Every evening | ORAL | Status: DC | PRN

## 2017-08-25 MED ORDER — TRANEXAMIC ACID 1000 MG/10ML IV SOLN
1000.0000 mg | Freq: Once | INTRAVENOUS | Status: AC
  Administered 2017-08-25: 1000 mg via INTRAVENOUS
  Filled 2017-08-25: qty 1100

## 2017-08-25 MED ORDER — BISACODYL 10 MG RE SUPP: 10.0000 mg | Freq: Every day | RECTAL | Status: DC | PRN

## 2017-08-25 MED ORDER — POLYETHYLENE GLYCOL 3350 17 G PO PACK
17.0000 g | PACK | Freq: Two times a day (BID) | ORAL | Status: DC
  Administered 2017-08-25 – 2017-08-26 (×2): 17 g via ORAL
  Filled 2017-08-25 (×2): qty 1

## 2017-08-25 MED ORDER — METHOCARBAMOL 500 MG PO TABS: 500.0000 mg | ORAL_TABLET | Freq: Four times a day (QID) | ORAL | 0 refills | Status: AC | PRN

## 2017-08-25 MED ORDER — FERROUS SULFATE 325 (65 FE) MG PO TABS: 325.0000 mg | ORAL_TABLET | Freq: Three times a day (TID) | ORAL | 3 refills | Status: AC

## 2017-08-25 MED ORDER — CEFAZOLIN SODIUM-DEXTROSE 2-4 GM/100ML-% IV SOLN
2.0000 g | INTRAVENOUS | Status: AC
  Administered 2017-08-25: 2 g via INTRAVENOUS

## 2017-08-25 MED ORDER — MIDAZOLAM HCL 2 MG/2ML IJ SOLN: 1.0000 mg | INTRAMUSCULAR | Status: DC | PRN

## 2017-08-25 MED ORDER — HYDROMORPHONE HCL 1 MG/ML IJ SOLN
INTRAMUSCULAR | Status: AC
  Administered 2017-08-25: 0.5 mg via INTRAVENOUS
  Filled 2017-08-25: qty 2

## 2017-08-25 MED ORDER — HYDROMORPHONE HCL 1 MG/ML IJ SOLN
INTRAMUSCULAR | Status: AC
  Filled 2017-08-25: qty 1

## 2017-08-25 MED ORDER — SODIUM CHLORIDE 0.9 % IJ SOLN
INTRAMUSCULAR | Status: AC
  Filled 2017-08-25: qty 50

## 2017-08-25 MED ORDER — ASPIRIN 81 MG PO CHEW: 81.0000 mg | CHEWABLE_TABLET | Freq: Two times a day (BID) | ORAL | 0 refills | Status: AC

## 2017-08-25 MED ORDER — HYDROMORPHONE HCL 1 MG/ML IJ SOLN
0.5000 mg | INTRAMUSCULAR | Status: DC | PRN
  Administered 2017-08-25: 19:00:00 2 mg via INTRAVENOUS
  Administered 2017-08-25: 1 mg via INTRAVENOUS
  Administered 2017-08-25: 22:00:00 2 mg via INTRAVENOUS
  Administered 2017-08-26: 08:00:00 1 mg via INTRAVENOUS
  Administered 2017-08-26: 06:00:00 2 mg via INTRAVENOUS
  Filled 2017-08-25 (×3): qty 2
  Filled 2017-08-25: qty 1

## 2017-08-25 MED ORDER — SODIUM CHLORIDE 0.9 % IJ SOLN
INTRAMUSCULAR | Status: DC | PRN
  Administered 2017-08-25: 30 mL

## 2017-08-25 MED ORDER — PROPOFOL 10 MG/ML IV BOLUS
INTRAVENOUS | Status: DC | PRN
  Administered 2017-08-25: 200 mg via INTRAVENOUS

## 2017-08-25 MED ORDER — ESOMEPRAZOLE MAGNESIUM 40 MG PO CPDR
40.0000 mg | DELAYED_RELEASE_CAPSULE | Freq: Every day | ORAL | Status: DC
  Administered 2017-08-26: 08:00:00 40 mg via ORAL
  Filled 2017-08-25: qty 1

## 2017-08-25 MED ORDER — MENTHOL 3 MG MT LOZG: 1.0000 | LOZENGE | OROMUCOSAL | Status: DC | PRN

## 2017-08-25 MED ORDER — ALUM & MAG HYDROXIDE-SIMETH 200-200-20 MG/5ML PO SUSP: 15.0000 mL | ORAL | Status: DC | PRN

## 2017-08-25 MED ORDER — ONDANSETRON HCL 4 MG/2ML IJ SOLN
INTRAMUSCULAR | Status: DC | PRN
  Administered 2017-08-25: 4 mg via INTRAVENOUS

## 2017-08-25 MED ORDER — METHOCARBAMOL 1000 MG/10ML IJ SOLN
500.0000 mg | Freq: Four times a day (QID) | INTRAMUSCULAR | Status: DC | PRN
  Administered 2017-08-25: 500 mg via INTRAVENOUS
  Filled 2017-08-25: qty 550

## 2017-08-25 MED ORDER — ACETAMINOPHEN 325 MG PO TABS: 650.0000 mg | ORAL_TABLET | ORAL | Status: DC | PRN

## 2017-08-25 MED ORDER — CEFAZOLIN SODIUM-DEXTROSE 2-4 GM/100ML-% IV SOLN
INTRAVENOUS | Status: AC
  Filled 2017-08-25: qty 100

## 2017-08-25 MED ORDER — PHENYTOIN SODIUM EXTENDED 100 MG PO CAPS
200.0000 mg | ORAL_CAPSULE | Freq: Every day | ORAL | Status: DC
  Filled 2017-08-25: qty 2

## 2017-08-25 MED ORDER — LIDOCAINE HCL (CARDIAC) 20 MG/ML IV SOLN
INTRAVENOUS | Status: DC | PRN
  Administered 2017-08-25: 100 mg via INTRAVENOUS
  Administered 2017-08-25: 25 mg via INTRATRACHEAL

## 2017-08-25 MED ORDER — DOCUSATE SODIUM 100 MG PO CAPS
100.0000 mg | ORAL_CAPSULE | Freq: Two times a day (BID) | ORAL | Status: DC
  Administered 2017-08-25 – 2017-08-26 (×2): 100 mg via ORAL
  Filled 2017-08-25 (×2): qty 1

## 2017-08-25 MED ORDER — MIDAZOLAM HCL 2 MG/2ML IJ SOLN
INTRAMUSCULAR | Status: AC
  Filled 2017-08-25: qty 2

## 2017-08-25 MED ORDER — KETOROLAC TROMETHAMINE 30 MG/ML IJ SOLN
INTRAMUSCULAR | Status: AC
  Filled 2017-08-25: qty 1

## 2017-08-25 MED ORDER — LACTATED RINGERS IV SOLN
INTRAVENOUS | Status: DC
  Administered 2017-08-25 (×2): via INTRAVENOUS
  Administered 2017-08-25: 1000 mL via INTRAVENOUS

## 2017-08-25 MED ORDER — BUPIVACAINE-EPINEPHRINE (PF) 0.25% -1:200000 IJ SOLN
INTRAMUSCULAR | Status: AC
  Filled 2017-08-25: qty 30

## 2017-08-25 MED ORDER — DOCUSATE SODIUM 100 MG PO CAPS: 100.0000 mg | ORAL_CAPSULE | Freq: Two times a day (BID) | ORAL | 0 refills | Status: AC

## 2017-08-25 MED ORDER — CEFAZOLIN SODIUM-DEXTROSE 2-4 GM/100ML-% IV SOLN
2.0000 g | Freq: Four times a day (QID) | INTRAVENOUS | Status: AC
  Administered 2017-08-25 – 2017-08-26 (×2): 2 g via INTRAVENOUS
  Filled 2017-08-25 (×2): qty 100

## 2017-08-25 MED ORDER — MIDAZOLAM HCL 5 MG/5ML IJ SOLN
INTRAMUSCULAR | Status: DC | PRN
  Administered 2017-08-25: 1 mg via INTRAVENOUS
  Administered 2017-08-25: 2 mg via INTRAVENOUS

## 2017-08-25 MED ORDER — HYDROMORPHONE HCL 1 MG/ML IJ SOLN
0.2500 mg | INTRAMUSCULAR | Status: DC | PRN
  Administered 2017-08-25 (×4): 0.5 mg via INTRAVENOUS

## 2017-08-25 MED ORDER — KETOROLAC TROMETHAMINE 30 MG/ML IJ SOLN: 30.0000 mg | Freq: Once | INTRAMUSCULAR | Status: DC | PRN

## 2017-08-25 MED ORDER — ONDANSETRON HCL 4 MG PO TABS: 4.0000 mg | ORAL_TABLET | Freq: Four times a day (QID) | ORAL | Status: DC | PRN

## 2017-08-25 SURGICAL SUPPLY — 47 items
ADH SKN CLS APL DERMABOND .7 (GAUZE/BANDAGES/DRESSINGS) ×1
BAG DECANTER FOR FLEXI CONT (MISCELLANEOUS) IMPLANT
BAG SPEC THK2 15X12 ZIP CLS (MISCELLANEOUS)
BAG ZIPLOCK 12X15 (MISCELLANEOUS) IMPLANT
BANDAGE ACE 6X5 VEL STRL LF (GAUZE/BANDAGES/DRESSINGS) ×2 IMPLANT
BLADE SAW SGTL 11.0X1.19X90.0M (BLADE) IMPLANT
BLADE SAW SGTL 13.0X1.19X90.0M (BLADE) ×2 IMPLANT
BOWL SMART MIX CTS (DISPOSABLE) ×2 IMPLANT
CAPT KNEE TOTAL 3 ATTUNE ×1 IMPLANT
CEMENT HV SMART SET (Cement) ×2 IMPLANT
COVER SURGICAL LIGHT HANDLE (MISCELLANEOUS) ×2 IMPLANT
CUFF TOURN SGL QUICK 34 (TOURNIQUET CUFF) ×2
CUFF TRNQT CYL 34X4X40X1 (TOURNIQUET CUFF) ×1 IMPLANT
DECANTER SPIKE VIAL GLASS SM (MISCELLANEOUS) ×2 IMPLANT
DERMABOND ADVANCED (GAUZE/BANDAGES/DRESSINGS) ×1
DERMABOND ADVANCED .7 DNX12 (GAUZE/BANDAGES/DRESSINGS) ×1 IMPLANT
DRAPE U-SHAPE 47X51 STRL (DRAPES) ×2 IMPLANT
DRESSING AQUACEL AG SP 3.5X10 (GAUZE/BANDAGES/DRESSINGS) ×1 IMPLANT
DRSG AQUACEL AG SP 3.5X10 (GAUZE/BANDAGES/DRESSINGS) ×2
DURAPREP 26ML APPLICATOR (WOUND CARE) ×4 IMPLANT
ELECT REM PT RETURN 15FT ADLT (MISCELLANEOUS) ×2 IMPLANT
GLOVE BIOGEL M 7.0 STRL (GLOVE) IMPLANT
GLOVE BIOGEL PI IND STRL 7.5 (GLOVE) ×1 IMPLANT
GLOVE BIOGEL PI IND STRL 8.5 (GLOVE) ×1 IMPLANT
GLOVE BIOGEL PI INDICATOR 7.5 (GLOVE) ×7
GLOVE BIOGEL PI INDICATOR 8.5 (GLOVE) ×1
GLOVE ECLIPSE 8.0 STRL XLNG CF (GLOVE) ×3 IMPLANT
GLOVE ORTHO TXT STRL SZ7.5 (GLOVE) ×4 IMPLANT
GOWN STRL REUS W/TWL LRG LVL3 (GOWN DISPOSABLE) ×5 IMPLANT
GOWN STRL REUS W/TWL XL LVL3 (GOWN DISPOSABLE) ×2 IMPLANT
HANDPIECE INTERPULSE COAX TIP (DISPOSABLE) ×2
MANIFOLD NEPTUNE II (INSTRUMENTS) ×2 IMPLANT
PACK TOTAL KNEE CUSTOM (KITS) ×2 IMPLANT
POSITIONER SURGICAL ARM (MISCELLANEOUS) ×2 IMPLANT
SET HNDPC FAN SPRY TIP SCT (DISPOSABLE) ×1 IMPLANT
SET PAD KNEE POSITIONER (MISCELLANEOUS) ×2 IMPLANT
SUT MNCRL AB 4-0 PS2 18 (SUTURE) ×2 IMPLANT
SUT STRATAFIX 0 PDS 27 VIOLET (SUTURE) ×2
SUT VIC AB 1 CT1 36 (SUTURE) ×2 IMPLANT
SUT VIC AB 2-0 CT1 27 (SUTURE) ×6
SUT VIC AB 2-0 CT1 TAPERPNT 27 (SUTURE) ×3 IMPLANT
SUTURE STRATFX 0 PDS 27 VIOLET (SUTURE) ×1 IMPLANT
SYR 50ML LL SCALE MARK (SYRINGE) ×2 IMPLANT
TRAY FOLEY W/METER SILVER 16FR (SET/KITS/TRAYS/PACK) ×2 IMPLANT
WATER STERILE IRR 1000ML POUR (IV SOLUTION) ×1 IMPLANT
WRAP KNEE MAXI GEL POST OP (GAUZE/BANDAGES/DRESSINGS) ×2 IMPLANT
YANKAUER SUCT BULB TIP 10FT TU (MISCELLANEOUS) ×2 IMPLANT

## 2017-08-25 NOTE — Anesthesia Postprocedure Evaluation (Signed)
Anesthesia Post Note  Patient: Sean Berry  Procedure(s) Performed: RIGHT TOTAL KNEE ARTHROPLASTY WITH REMOVAL OF HARDWARE (Right Knee)     Patient location during evaluation: PACU Anesthesia Type: General Level of consciousness: sedated and patient cooperative Pain management: pain level controlled Vital Signs Assessment: post-procedure vital signs reviewed and stable Respiratory status: spontaneous breathing Cardiovascular status: stable Anesthetic complications: no    Last Vitals:  Vitals:   08/25/17 1415 08/25/17 1420  BP: 118/79   Pulse: (!) 55 (!) 54  Resp: (!) 9 12  Temp:    SpO2: 100% 100%    Last Pain:  Vitals:   08/25/17 1400  TempSrc:   PainSc: 5                  Lewie LoronJohn Ciaran Begay

## 2017-08-25 NOTE — Anesthesia Procedure Notes (Signed)
Anesthesia Regional Block: Adductor canal block   Pre-Anesthetic Checklist: ,, timeout performed, Correct Patient, Correct Site, Correct Laterality, Correct Procedure, Correct Position, site marked, Risks and benefits discussed,  Surgical consent,  Pre-op evaluation,  At surgeon's request and post-op pain management  Laterality: Right  Prep: chloraprep       Needles:  Injection technique: Single-shot  Needle Type: Stimiplex     Needle Length: 9cm  Needle Gauge: 21     Additional Needles:   Procedures:,,,, ultrasound used (permanent image in chart),,,,  Narrative:  Start time: 08/25/2017 9:51 AM End time: 08/25/2017 9:54 AM Injection made incrementally with aspirations every 5 mL.  Performed by: Personally  Anesthesiologist: Lewie LoronGermeroth, Tavis Kring, MD  Additional Notes: BP cuff, EKG monitors applied. Sedation begun. Artery and nerve location verified with U/S and anesthetic injected incrementally, slowly, and after negative aspirations under direct u/s guidance. Good fascial /perineural spread. Tolerated well.

## 2017-08-25 NOTE — Anesthesia Procedure Notes (Signed)
Procedure Name: Intubation Date/Time: 08/25/2017 10:16 AM Performed by: Lissa Morales, CRNA Pre-anesthesia Checklist: Patient identified, Emergency Drugs available, Suction available and Patient being monitored Patient Re-evaluated:Patient Re-evaluated prior to induction Oxygen Delivery Method: Circle system utilized Preoxygenation: Pre-oxygenation with 100% oxygen Induction Type: IV induction Ventilation: Mask ventilation without difficulty Laryngoscope Size: Mac and 4 Grade View: Grade I Tube type: Oral Tube size: 7.5 mm Number of attempts: 1 Airway Equipment and Method: Stylet and Oral airway Placement Confirmation: ETT inserted through vocal cords under direct vision,  positive ETCO2 and breath sounds checked- equal and bilateral Secured at: 22 cm Tube secured with: Tape Dental Injury: Teeth and Oropharynx as per pre-operative assessment

## 2017-08-25 NOTE — Anesthesia Preprocedure Evaluation (Addendum)
Anesthesia Evaluation  Patient identified by MRN, date of birth, ID band Patient awake    Reviewed: Allergy & Precautions, NPO status , Patient's Chart, lab work & pertinent test results  Airway Mallampati: II  TM Distance: >3 FB Neck ROM: Full    Dental no notable dental hx.    Pulmonary neg pulmonary ROS,    Pulmonary exam normal breath sounds clear to auscultation       Cardiovascular hypertension, Pt. on medications Normal cardiovascular exam Rhythm:Regular Rate:Normal     Neuro/Psych Seizures -, Well Controlled,  PSYCHIATRIC DISORDERS Anxiety negative psych ROS   GI/Hepatic negative GI ROS, Neg liver ROS,   Endo/Other  negative endocrine ROS  Renal/GU negative Renal ROS     Musculoskeletal  (+) Arthritis ,   Abdominal   Peds  Hematology negative hematology ROS (+)   Anesthesia Other Findings   Reproductive/Obstetrics negative OB ROS                             Anesthesia Physical Anesthesia Plan  ASA: II  Anesthesia Plan: General   Post-op Pain Management:  Regional for Post-op pain   Induction: Intravenous  PONV Risk Score and Plan: 3 and Ondansetron, Dexamethasone, Treatment may vary due to age or medical condition and Midazolam  Airway Management Planned: Natural Airway  Additional Equipment:   Intra-op Plan:   Post-operative Plan:   Informed Consent: I have reviewed the patients History and Physical, chart, labs and discussed the procedure including the risks, benefits and alternatives for the proposed anesthesia with the patient or authorized representative who has indicated his/her understanding and acceptance.   Dental advisory given  Plan Discussed with: CRNA  Anesthesia Plan Comments:        Anesthesia Quick Evaluation

## 2017-08-25 NOTE — Interval H&P Note (Signed)
History and Physical Interval Note:  08/25/2017 8:45 AM  Sean Berry  has presented today for surgery, with the diagnosis of Right knee osteoarthritis  The various methods of treatment have been discussed with the patient and family. After consideration of risks, benefits and other options for treatment, the patient has consented to  Procedure(s) with comments: RIGHT TOTAL KNEE ARTHROPLASTY WITH REMOVAL OF HARDWARE (Right) - 70 mins as a surgical intervention .  The patient's history has been reviewed, patient examined, no change in status, stable for surgery.  I have reviewed the patient's chart and labs.  Questions were answered to the patient's satisfaction.     Shelda PalLIN,Kathaleya Mcduffee D

## 2017-08-25 NOTE — Discharge Instructions (Signed)

## 2017-08-25 NOTE — Plan of Care (Signed)
  Progressing Education: Knowledge of General Education information will improve 08/25/2017 1949 - Progressing by Cordie Griceodriguez, Avaree Gilberti A, RN Health Behavior/Discharge Planning: Ability to manage health-related needs will improve 08/25/2017 1949 - Progressing by Cordie Griceodriguez, Catharine Kettlewell A, RN Clinical Measurements: Ability to maintain clinical measurements within normal limits will improve 08/25/2017 1949 - Progressing by Cordie Griceodriguez, Rolla Servidio A, RN Will remain free from infection 08/25/2017 1949 - Progressing by Cordie Griceodriguez, Khiry Pasquariello A, RN Diagnostic test results will improve 08/25/2017 1949 - Progressing by Cordie Griceodriguez, Jyron Turman A, RN Respiratory complications will improve 08/25/2017 1949 - Progressing by Cordie Griceodriguez, Sanjna Haskew A, RN Cardiovascular complication will be avoided 08/25/2017 1949 - Progressing by Cordie Griceodriguez, Evon Lopezperez A, RN Activity: Risk for activity intolerance will decrease 08/25/2017 1949 - Progressing by Cordie Griceodriguez, Raseel Jans A, RN Nutrition: Adequate nutrition will be maintained 08/25/2017 1949 - Progressing by Cordie Griceodriguez, Shaul Trautman A, RN Coping: Level of anxiety will decrease 08/25/2017 1949 - Progressing by Cordie Griceodriguez, Amram Maya A, RN Elimination: Will not experience complications related to bowel motility 08/25/2017 1949 - Progressing by Cordie Griceodriguez, Keisuke Hollabaugh A, RN Will not experience complications related to urinary retention 08/25/2017 1949 - Progressing by Cordie Griceodriguez, Rajvi Armentor A, RN Pain Managment: General experience of comfort will improve 08/25/2017 1949 - Progressing by Cordie Griceodriguez, Edina Winningham A, RN Safety: Ability to remain free from injury will improve 08/25/2017 1949 - Progressing by Cordie Griceodriguez, Cordarrius Coad A, RN Skin Integrity: Risk for impaired skin integrity will decrease 08/25/2017 1949 - Progressing by Cordie Griceodriguez, Li Fragoso A, RN Education: Knowledge of the prescribed therapeutic regimen will improve 08/25/2017 1949 - Progressing by Cordie Griceodriguez, Kuba Shepherd A, RN Activity: Ability to avoid complications of mobility impairment will  improve 08/25/2017 1949 - Progressing by Cordie Griceodriguez, Arleth Mccullar A, RN Range of joint motion will improve 08/25/2017 1949 - Progressing by Cordie Griceodriguez, Oslo Huntsman A, RN Clinical Measurements: Postoperative complications will be avoided or minimized 08/25/2017 1949 - Progressing by Cordie Griceodriguez, Domnique Vantine A, RN Pain Management: Pain level will decrease with appropriate interventions 08/25/2017 1949 - Progressing by Cordie Griceodriguez, Arvada Seaborn A, RN Skin Integrity: Signs of wound healing will improve 08/25/2017 1949 - Progressing by Cordie Griceodriguez, Ramiyah Mcclenahan A, RN

## 2017-08-25 NOTE — Op Note (Addendum)
NAME:  [facility]                      MEDICAL RECORD NO.:  [id number]                             FACILITY[facility]  [facility]      PHYSICIAN:  Madlyn FrankelMatthew D. Charlann Boxerlin, M.D.  DATE OF BIRTH:  10/15/1961      DATE OF PROCEDURE:  08/25/2017                                     OPERATIVE REPORT         PREOPERATIVE DIAGNOSIS:  Right knee osteoarthritis.      POSTOPERATIVE DIAGNOSIS:  Right knee osteoarthritis with history of prior ACL reconstruction with retained femoral and tibial screws      FINDINGS:  The patient was noted to have complete loss of cartilage and   bone-on-bone arthritis with associated osteophytes in the medial and patellofemoral compartments of   the knee with a significant synovitis and associated effusion.      PROCEDURE:  Right total knee replacement. Removal of deep implant, right proximal tibia     COMPONENTS USED:  DePuy Attune rotating platform posterior stabilized knee   system, a size 7 femur, 6 tibia, size 5 mm PS AOX insert, and 38 anatomic patellar   button.      SURGEON:  Madlyn FrankelMatthew D. Charlann Boxerlin, M.D.      ASSISTANT:  Lanney GinsMatthew Babish, PA-C.      ANESTHESIA:  General and Regional.      SPECIMENS:  None.      COMPLICATION:  None.      DRAINS:  None.  EBL: <150cc      TOURNIQUET TIME:   Total Tourniquet Time Documented: Thigh (Right) - 33 minutes Total: Thigh (Right) - 33 minutes  .      The patient was stable to the recovery room.      INDICATION FOR PROCEDURE:  Rebecca Eatonhurman M Jessop is a 56 y.o. male patient of   mine.  The patient had been seen, evaluated, and treated conservatively in the   office with medication, activity modification, and injections.  The patient had   radiographic changes of bone-on-bone arthritis with endplate sclerosis and osteophytes noted.      The patient failed conservative measures including medication, injections, and activity modification, and at this point was ready for more definitive measures.   Based on the radiographic changes  and failed conservative measures, the patient   decided to proceed with total knee replacement.  Risks of infection,   DVT, component failure, need for revision surgery, postop course, and   expectations were all   discussed and reviewed.  Consent was obtained for benefit of pain   relief.      PROCEDURE IN DETAIL:  The patient was brought to the operative theater.   Once adequate anesthesia, preoperative antibiotics, 2 gm of Ancef, 1 gm of Tranexamic Acid, and 10 mg of Decadron administered, the patient was positioned supine with the right thigh tourniquet placed.  The  right lower extremity was prepped and draped in sterile fashion.  A time-   out was performed identifying the patient, planned procedure, and   extremity.      The right lower [facility].  The leg was   exsanguinated, tourniquet elevated to 250 mmHg.  A midline incision was   made followed by median parapatellar arthrotomy.  Following initial   exposure, attention was first directed to the patella.  Precut   measurement was noted to be 25 mm.  I resected down to 14 mm and used a   38 anatomic patellar button to restore patellar height as well as cover the cut   surface.      The lug holes were drilled and a metal shim was placed to protect the   patella from retractors and saw blades.      At this point, attention was now directed to the femur.  The femoral   canal was opened with a drill, irrigated to try to prevent fat emboli.  An   intramedullary rod was passed at 3 degrees valgus, 9 mm of bone was   resected off the distal femur.  Following this resection, the tibia was   subluxated anteriorly.  Using the extramedullary guide, 2 mm of bone was resected off   the proximal medial tibia.  We confirmed the gap would be   stable medially and laterally with a size 5 spacer block as well as confirmed   the cut was perpendicular in the coronal plane, checking with an alignment rod.       Once this was done, I sized the femur to be a size 7 in the anterior-   posterior dimension, chose a standard component based on medial and   lateral dimension.  The size 7 rotation block was then pinned in   position anterior referenced using the C-clamp to set rotation.  The   anterior, posterior, and  chamfer cuts were made without difficulty nor   notching making certain that I was along the anterior cortex to help   with flexion gap stability.      The final box cut was made off the lateral aspect of distal femur.      At this point, the tibia was sized to be a size 6, the size 6 tray was   then pinned in position through the medial third of the tubercle,   drilled, and keel punched.    At this point I found the proximal aspect of the tibial screw and thus removed it.  Once removed I used bone from femoral bone cuts to pack the void.  Trial reduction was now carried with a 7 femur,  6 tibia, a size 5 mm PS insert, and the 38 anatomic patella botton.  The knee was brought to   extension, full extension with good flexion stability with the patella   tracking through the trochlea without application of pressure.  Given   all these findings the femoral lug holes were drilled and then the trial components removed.  Final components were   opened and cement was mixed.  The knee was irrigated with normal saline   solution and pulse lavage.  The synovial lining was   then injected with 30 cc of 0.25% Marcaine with epinephrine and 1 cc of Toradol plus 30 cc of NS for a total of 61 cc.      The knee was irrigated.  Final implants were then cemented onto clean and   dried cut surfaces of bone with the knee brought to extension with a size 5 mm PS trial insert.      Once the cement had fully cured, the excess cement was removed  throughout the knee.  I confirmed I was satisfied with the range of   motion and stability, and the final size 5 mm PS AOX insert was chosen.  It was   placed into  the knee.      The tourniquet had been let down at 33 minutes.  No significant   hemostasis required.  The   extensor mechanism was then reapproximated using #1 Vicryl and #0 Stratafix sutures with the knee   in flexion.  The   remaining wound was closed with 2-0 Vicryl and running 4-0 Monocryl.   The knee was cleaned, dried, dressed sterilely using Dermabond and   Aquacel dressing.  The patient was then   brought to recovery room in stable condition, tolerating the procedure   well.   Please note that Physician Assistant, Lanney GinsMatthew Babish, PA-C, was present for the entirety of the case, and was utilized for pre-operative positioning, peri-operative retractor management, general facilitation of the procedure.  He was also utilized for primary wound closure at the end of the case.              Madlyn FrankelMatthew D. Charlann Boxerlin, M.D.    08/25/2017 11:38 AM

## 2017-08-25 NOTE — Transfer of Care (Signed)
Immediate Anesthesia Transfer of Care Note  Patient: Sean Berry  Procedure(s) Performed: RIGHT TOTAL KNEE ARTHROPLASTY WITH REMOVAL OF HARDWARE (Right Knee)  Patient Location: PACU  Anesthesia Type:General  Level of Consciousness: awake, alert , oriented and patient cooperative  Airway & Oxygen Therapy: Patient Spontanous Breathing and Patient connected to face mask oxygen  Post-op Assessment: Report given to RN, Post -op Vital signs reviewed and stable and Patient moving all extremities X 4  Post vital signs: stable  Last Vitals:  Vitals:   08/25/17 0746 08/25/17 1208  BP: 111/83   Pulse: (!) 56   Resp: 16   Temp: 36.6 C 36.5 C  SpO2: 95%     Last Pain:  Vitals:   08/25/17 0746  TempSrc: Oral      Patients Stated Pain Goal: 4 (08/25/17 0815)  Complications: No apparent anesthesia complications

## 2017-08-26 ENCOUNTER — Encounter (HOSPITAL_COMMUNITY): Payer: Self-pay | Admitting: Orthopedic Surgery

## 2017-08-26 DIAGNOSIS — E663 Overweight: Secondary | ICD-10-CM | POA: Diagnosis present

## 2017-08-26 DIAGNOSIS — M1711 Unilateral primary osteoarthritis, right knee: Secondary | ICD-10-CM | POA: Diagnosis not present

## 2017-08-26 LAB — CBC
HCT: 33.4 % — ABNORMAL LOW (ref 39.0–52.0)
Hemoglobin: 11.4 g/dL — ABNORMAL LOW (ref 13.0–17.0)
MCH: 31.1 pg (ref 26.0–34.0)
MCHC: 34.1 g/dL (ref 30.0–36.0)
MCV: 91.3 fL (ref 78.0–100.0)
PLATELETS: 225 10*3/uL (ref 150–400)
RBC: 3.66 MIL/uL — AB (ref 4.22–5.81)
RDW: 13.5 % (ref 11.5–15.5)
WBC: 10.2 10*3/uL (ref 4.0–10.5)

## 2017-08-26 LAB — BASIC METABOLIC PANEL
Anion gap: 7 (ref 5–15)
BUN: 18 mg/dL (ref 6–20)
CALCIUM: 8 mg/dL — AB (ref 8.9–10.3)
CO2: 27 mmol/L (ref 22–32)
Chloride: 104 mmol/L (ref 101–111)
Creatinine, Ser: 0.91 mg/dL (ref 0.61–1.24)
GFR calc Af Amer: >60 mL/min (ref >60)
Glucose, Bld: 100 mg/dL — ABNORMAL HIGH (ref 65–99)
POTASSIUM: 3.9 mmol/L (ref 3.5–5.1)
SODIUM: 138 mmol/L (ref 135–145)

## 2017-08-26 NOTE — Progress Notes (Signed)
Physical Therapy Treatment Patient Details Name: Sean Berry MRN: [id number] DOB: 08/16/1961 Today's Date: 08/26/2017    History of Present Illness Pt s/p  R TKR    PT Comments    Pt very motivated and progressing well with mobility.  Reviewed stairs and home therex program with written instruction provided.  Follow Up Recommendations  DC plan and follow up therapy as arranged by surgeon     Equipment Recommendations  None recommended by PT    Recommendations for Other Services OT consult     Precautions / Restrictions Precautions Precautions: Knee Precaution Comments: reviewed no pillow under R knee with pt and his wife Restrictions Weight Bearing Restrictions: No Other Position/Activity Restrictions: WBAT    Mobility  Bed Mobility Overal bed mobility: Needs Assistance Bed Mobility: Supine to Sit     Supine to sit: Min guard     General bed mobility comments: pt OOB in recliner on arrival  Transfers Overall transfer level: Needs assistance Equipment used: Rolling walker (2 wheeled) Transfers: Sit to/from Stand Sit to Stand: Supervision         General transfer comment: cues for transition position and use of UEs to self assist  Ambulation/Gait Ambulation/Gait assistance: Min guard;Supervision Ambulation Distance (Feet): 150 Feet Assistive device: Rolling walker (2 wheeled) Gait Pattern/deviations: Step-to pattern;Decreased step length - right;Decreased step length - left;Shuffle;Trunk flexed Gait velocity: decr Gait velocity interpretation: Below normal speed for age/gender General Gait Details: cues for sequence, posture and position from RW   Stairs Stairs: Yes   Stair Management: One rail Left;Step to pattern;Forwards;With crutches Number of Stairs: 5 General stair comments: cues for sequence and foot/crutch placement  Wheelchair Mobility    Modified Rankin (Stroke Patients Only)       Balance Overall balance assessment: No  apparent balance deficits (not formally assessed)                                          Cognition Arousal/Alertness: Awake/alert Behavior During Therapy: WFL for tasks assessed/performed;Impulsive Overall Cognitive Status: Within Functional Limits for tasks assessed                                        Exercises Total Joint Exercises Ankle Circles/Pumps: AROM;Both;15 reps;Supine Quad Sets: AROM;Both;10 reps;Supine Heel Slides: AAROM;Right;10 reps;Supine Straight Leg Raises: AAROM;Right;10 reps;Supine Long Arc Quad: AROM;Right;5 reps;Seated Knee Flexion: AROM;AAROM;Right;5 reps;Seated    General Comments        Pertinent Vitals/Pain Pain Assessment: 0-10 Pain Score: 7  Pain Location: R knee Pain Descriptors / Indicators: Aching;Sore Pain Intervention(s): Limited activity within patient's tolerance;Monitored during session;Premedicated before session;Ice applied    Home Living Family/patient expects to be discharged to:: Private residence Living Arrangements: Spouse/significant other Available Help at Discharge: Family Type of Home: House Home Access: Stairs to enter Entrance Stairs-Rails: Right;Left Home Layout: One level Home Equipment: Environmental consultantWalker - 2 wheels;Crutches      Prior Function Level of Independence: Independent          PT Goals (current goals can now be found in the care plan section) Acute Rehab PT Goals Patient Stated Goal: Regain IND PT Goal Formulation: With patient Time For Goal Achievement: 09/05/17 Potential to Achieve Goals: Good Progress towards PT goals: Progressing toward goals    Frequency  7X/week      PT Plan Current plan remains appropriate    Co-evaluation              AM-PAC PT "6 Clicks" Daily Activity  Outcome Measure  Difficulty turning over in bed (including adjusting bedclothes, sheets and blankets)?: A Little Difficulty moving from lying on back to sitting on the side of  the bed? : A Little Difficulty sitting down on and standing up from a chair with arms (e.g., wheelchair, bedside commode, etc,.)?: A Little Help needed moving to and from a bed to chair (including a wheelchair)?: A Little Help needed walking in hospital room?: A Little Help needed climbing 3-5 steps with a railing? : A Little 6 Click Score: 18    End of Session Equipment Utilized During Treatment: Gait belt Activity Tolerance: Patient tolerated treatment well Patient left: in chair;with call bell/phone within reach;with family/visitor present Nurse Communication: Mobility status PT Visit Diagnosis: Difficulty in walking, not elsewhere classified (R26.2)     Time: [id number] PT Time Calculation (min) (ACUTE ONLY): 48 min  Charges:  $Gait Training: 8-22 mins $Therapeutic Exercise: 8-22 mins $Therapeutic Activity: 8-22 mins                    G Codes:  Functional Assessment Tool Used: Clinical judgement Functional Limitation: Mobility: Walking and moving around Mobility: Walking and Moving Around Current Status (V4098(G8978): At least 40 percent but less than 60 percent impaired, limited or restricted Mobility: Walking and Moving Around Goal Status [phone](G8979): At least 1 percent but less than 20 percent impaired, limited or restricted    Pg [phone]    Alberta Cairns 08/26/2017, 3:42 PM

## 2017-08-26 NOTE — Evaluation (Signed)
Occupational Therapy Evaluation Patient Details Name: Sean Berry MRN: [id number] DOB: 07/20/1961 Today's Date: 08/26/2017    History of Present Illness Pt s/p  R TKR   Clinical Impression   Pt is at min A level with LB ADLs and min guard A - sup level with ADL mobility using RW. Pt will have 24/7 assist from his wife. All education completed and no further acute OT is indicated at this time    Follow Up Recommendations  DC plan and follow up therapy as arranged by surgeon;No OT follow up    Equipment Recommendations  Tub/shower bench;Other (comment);3 in 1 bedside commode(grab bars)    Recommendations for Other Services       Precautions / Restrictions Precautions Precautions: Knee Precaution Comments: reviewed no pillow under R knee with pt and his wife Restrictions Weight Bearing Restrictions: No Other Position/Activity Restrictions: WBAT      Mobility Bed Mobility Overal bed mobility: Needs Assistance Bed Mobility: Supine to Sit     Supine to sit: Min guard     General bed mobility comments: pt OOB in recliner upon OT arrival  Transfers Overall transfer level: Needs assistance Equipment used: Rolling walker (2 wheeled) Transfers: Sit to/from Stand Sit to Stand: Min guard;Supervision         General transfer comment: min guard A initially from recliner to RW    Balance Overall balance assessment: No apparent balance deficits (not formally assessed)                                         ADL either performed or assessed with clinical judgement   ADL Overall ADL's : Needs assistance/impaired     Grooming: Wash/dry hands;Wash/dry face;Standing;Min guard   Upper Body Bathing: Set up;Sitting   Lower Body Bathing: Minimal assistance;Sit to/from stand;With caregiver independent assisting   Upper Body Dressing : Set up;Sitting   Lower Body Dressing: Sit to/from stand;With caregiver independent assisting;Minimal assistance    Toilet Transfer: Min guard;Supervision/safety;RW;Comfort height toilet;Grab bars;Ambulation   Toileting- Clothing Manipulation and Hygiene: Supervision/safety;Sit to/from stand   Tub/ Shower Transfer: Min guard;Supervision/safety;3 in 1;Grab bars;Rolling walker;Ambulation   Functional mobility during ADLs: Min guard;Supervision/safety;Rolling walker General ADL Comments: pt educated on use of tub bench and reacher for home use. wife will assist pt 24/7 prn     Vision Baseline Vision/History: Wears glasses Wears Glasses: At all times Patient Visual Report: No change from baseline       Perception     Praxis      Pertinent Vitals/Pain Pain Assessment: 0-10 Pain Score: 7  Pain Location: R knee Pain Descriptors / Indicators: Aching;Sore Pain Intervention(s): Limited activity within patient's tolerance;Monitored during session;Repositioned;Ice applied;Patient requesting pain meds-RN notified     Hand Dominance Right   Extremity/Trunk Assessment Upper Extremity Assessment Upper Extremity Assessment: Overall WFL for tasks assessed   Lower Extremity Assessment Lower Extremity Assessment: Defer to PT evaluation RLE Deficits / Details: 3-/5 QUADS with AAROM at knee -10 - 60   Cervical / Trunk Assessment Cervical / Trunk Assessment: Normal   Communication Communication Communication: No difficulties   Cognition Arousal/Alertness: Awake/alert Behavior During Therapy: WFL for tasks assessed/performed;Impulsive Overall Cognitive Status: Within Functional Limits for tasks assessed  General Comments       Exercises Exercises: Total Joint Total Joint Exercises Ankle Circles/Pumps: AROM;Both;15 reps;Supine Quad Sets: AROM;Both;10 reps;Supine Heel Slides: AAROM;Right;10 reps;Supine Straight Leg Raises: AAROM;Right;10 reps;Supine   Shoulder Instructions      Home Living Family/patient expects to be discharged to:: Private  residence Living Arrangements: Spouse/significant other Available Help at Discharge: Family Type of Home: House Home Access: Stairs to enter Secretary/administratorntrance Stairs-Number of Steps: 5 Entrance Stairs-Rails: Right;Left Home Layout: One level     Bathroom Shower/Tub: Chief Strategy OfficerTub/shower unit   Bathroom Toilet: Standard     Home Equipment: Environmental consultantWalker - 2 wheels;Crutches          Prior Functioning/Environment Level of Independence: Independent                 OT Problem List: Decreased activity tolerance;Pain;Impaired balance (sitting and/or standing);Decreased knowledge of use of DME or AE      OT Treatment/Interventions:      OT Goals(Current goals can be found in the care plan section) Acute Rehab OT Goals Patient Stated Goal: Regain IND OT Goal Formulation: With patient/family Time For Goal Achievement: 09/02/17 Potential to Achieve Goals: Good  OT Frequency:     Barriers to D/C:            Co-evaluation              AM-PAC PT "6 Clicks" Daily Activity     Outcome Measure Help from another person eating meals?: None Help from another person taking care of personal grooming?: A Little Help from another person toileting, which includes using toliet, bedpan, or urinal?: A Little Help from another person bathing (including washing, rinsing, drying)?: A Little Help from another person to put on and taking off regular upper body clothing?: A Little Help from another person to put on and taking off regular lower body clothing?: A Little 6 Click Score: 19   End of Session Equipment Utilized During Treatment: Rolling walker;Other (comment)(3 in 1) Nurse Communication: Patient requests pain meds  Activity Tolerance: Patient limited by pain Patient left: in chair;with call bell/phone within reach;with family/visitor present  OT Visit Diagnosis: Pain;Unsteadiness on feet (R26.81) Pain - Right/Left: Right Pain - part of body: Knee                Time: [id number] OT Time  Calculation (min): 25 min Charges:  OT General Charges $OT Visit: 1 Visit OT Evaluation $OT Eval Low Complexity: 1 Low OT Treatments $Therapeutic Activity: 8-22 mins G-Codes: OT G-codes **NOT FOR INPATIENT CLASS** Functional Assessment Tool Used: AM-PAC 6 Clicks Daily Activity Functional Limitation: Other OT primary Other OT Primary Current Status (V4098(G8990): At least 20 percent but less than 40 percent impaired, limited or restricted Other OT Primary Goal Status (J1914(G8991): At least 20 percent but less than 40 percent impaired, limited or restricted Other OT Primary Discharge Status [phone](G8992): At least 20 percent but less than 40 percent impaired, limited or restricted     Galen ManilaSpencer, Mena Simonis Jeanette 08/26/2017, 2:11 PM

## 2017-08-26 NOTE — Progress Notes (Signed)
Discharge planning, no HH needs identified. [phone] 

## 2017-08-26 NOTE — Progress Notes (Signed)
     Subjective: 1 Day Post-Op Procedure(s) (LRB): RIGHT TOTAL KNEE ARTHROPLASTY WITH REMOVAL OF HARDWARE (Right)   Patient reports pain as mild, pain controlled. Patient stated he had a long night and didn't sleep well because of all of the alarms and noises.  No events otherwise. Plan on discharge today after 2 PT sessions.  Objective:   VITALS:   Vitals:   08/26/17 0223 08/26/17 0550  BP: 106/63 106/77  Pulse: 74 74  Resp: 15 16  Temp: 98.1 F (36.7 C) 98.2 F (36.8 C)  SpO2: 97% 94%    Dorsiflexion/Plantar flexion intact Incision: dressing C/D/I No cellulitis present Compartment soft  LABS Recent Labs    08/26/17 0536  HGB 11.4*  HCT 33.4*  WBC 10.2  PLT 225    Recent Labs    08/26/17 0536  NA 138  K 3.9  BUN 18  CREATININE 0.91  GLUCOSE 100*     Assessment/Plan: 1 Day Post-Op Procedure(s) (LRB): RIGHT TOTAL KNEE ARTHROPLASTY WITH REMOVAL OF HARDWARE (Right) Foley cath d/c'ed Advance diet Up with therapy D/C IV fluids Discharge home [facility] Follow up with OLIN,Zykee Avakian D in 2 weeks.  Contact information:  [facility] [address], [address] [place] [id number] [phone]    Overweight (BMI 25-29.9) Estimated body mass index is 26.78 kg/m as calculated from the following:   Height as of this encounter: 5\' 7"  (1.702 m).   Weight as of this encounter: 77.6 kg (171 lb). Patient also counseled that weight may inhibit the healing process Patient counseled that losing weight will help with future health issues       Anastasio AuerbachMatthew S. Giovannina Mun   PAC  08/26/2017, 8:06 AM

## 2017-08-26 NOTE — Evaluation (Signed)
Physical Therapy Evaluation Patient Details Name: Sean Berry MRN: [id number] DOB: 01/04/1961 Today's Date: 08/26/2017   History of Present Illness  Pt s/p  R TKR  Clinical Impression  Pt s/p R TKR and presents with decreased R LE strength/ROM and post op pain limiting functional mobility.  Pt should progress to dc home with family assist.    Follow Up Recommendations DC plan and follow up therapy as arranged by surgeon    Equipment Recommendations       Recommendations for Other Services OT consult     Precautions / Restrictions Precautions Precautions: Knee Restrictions Weight Bearing Restrictions: No Other Position/Activity Restrictions: WBAT      Mobility  Bed Mobility Overal bed mobility: Needs Assistance Bed Mobility: Supine to Sit     Supine to sit: Min guard     General bed mobility comments: cues for sequence and use of L LE   Transfers Overall transfer level: Needs assistance Equipment used: Rolling walker (2 wheeled) Transfers: Sit to/from Stand Sit to Stand: Min guard         General transfer comment: cues for LE management and use of UEs to self assist  Ambulation/Gait Ambulation/Gait assistance: Min assist;Min guard Ambulation Distance (Feet): 100 Feet Assistive device: Rolling walker (2 wheeled) Gait Pattern/deviations: Step-to pattern;Decreased step length - right;Decreased step length - left;Shuffle;Trunk flexed Gait velocity: decr Gait velocity interpretation: Below normal speed for age/gender General Gait Details: cues for sequence, posture and position from [employer]  Stairs            Wheelchair Mobility    Modified Rankin (Stroke Patients Only)       Balance                                             Pertinent Vitals/Pain Pain Assessment: 0-10 Pain Score: 5  Pain Descriptors / Indicators: Aching;Sore Pain Intervention(s): Limited activity within patient's tolerance;Monitored during  session;Premedicated before session;Ice applied    Home Living Family/patient expects to be discharged to:: Private residence Living Arrangements: Spouse/significant other Available Help at Discharge: Family Type of Home: House Home Access: Stairs to enter Entrance Stairs-Rails: Doctor, general practiceight;Left Entrance Stairs-Number of Steps: 5 Home Layout: One level Home Equipment: Environmental consultantWalker - 2 wheels;Crutches      Prior Function Level of Independence: Independent               Hand Dominance        Extremity/Trunk Assessment   Upper Extremity Assessment Upper Extremity Assessment: Overall WFL for tasks assessed    Lower Extremity Assessment Lower Extremity Assessment: RLE deficits/detail RLE Deficits / Details: 3-/5 QUADS with AAROM at knee -10 - 60    Cervical / Trunk Assessment Cervical / Trunk Assessment: Normal  Communication   Communication: No difficulties  Cognition Arousal/Alertness: Awake/alert Behavior During Therapy: WFL for tasks assessed/performed;Impulsive Overall Cognitive Status: Within Functional Limits for tasks assessed                                        General Comments      Exercises Total Joint Exercises Ankle Circles/Pumps: AROM;Both;15 reps;Supine Quad Sets: AROM;Both;10 reps;Supine Heel Slides: AAROM;Right;10 reps;Supine Straight Leg Raises: AAROM;Right;10 reps;Supine   Assessment/Plan    PT Assessment Patient needs continued PT services  PT Problem List  Decreased strength;Decreased range of motion;Decreased activity tolerance;Decreased mobility;Pain;Decreased knowledge of use of DME       PT Treatment Interventions DME instruction;Gait training;Stair training;Functional mobility training;Therapeutic activities;Therapeutic exercise;Patient/family education    PT Goals (Current goals can be found in the Care Plan section)  Acute Rehab PT Goals Patient Stated Goal: Regain IND PT Goal Formulation: With patient Time For Goal  Achievement: 09/05/17 Potential to Achieve Goals: Good    Frequency 7X/week   Barriers to discharge        Co-evaluation               AM-PAC PT "6 Clicks" Daily Activity  Outcome Measure Difficulty turning over in bed (including adjusting bedclothes, sheets and blankets)?: A Little Difficulty moving from lying on back to sitting on the side of the bed? : A Little Difficulty sitting down on and standing up from a chair with arms (e.g., wheelchair, bedside commode, etc,.)?: A Little Help needed moving to and from a bed to chair (including a wheelchair)?: A Little Help needed walking in hospital room?: A Little Help needed climbing 3-5 steps with a railing? : A Little 6 Click Score: 18    End of Session Equipment Utilized During Treatment: Gait belt Activity Tolerance: Patient tolerated treatment well Patient left: in chair;with call bell/phone within reach;with family/visitor present Nurse Communication: Mobility status PT Visit Diagnosis: Difficulty in walking, not elsewhere classified (R26.2)    Time: [id number] PT Time Calculation (min) (ACUTE ONLY): 31 min   Charges:   PT Evaluation $PT Eval Low Complexity: 1 Low PT Treatments $Therapeutic Exercise: 8-22 mins   PT G Codes:   PT G-Codes **NOT FOR INPATIENT CLASS** Functional Assessment Tool Used: Clinical judgement Functional Limitation: Mobility: Walking and moving around Mobility: Walking and Moving Around Current Status ([id number]([id number]): At least 40 percent but less than 60 percent impaired, limited or restricted Mobility: Walking and Moving Around Goal Status [phone](G8979): At least 1 percent but less than 20 percent impaired, limited or restricted    Pg [phone]   Essa Wenk 08/26/2017, 1:20 PM

## 2017-09-01 NOTE — Discharge Summary (Signed)
Physician Discharge Summary  Patient ID: Sean Berry MRN: [id number] DOB/AGE: [birth year] 56 y.o.  Admit date: 08/25/2017 Discharge date: 08/26/2017   Procedures:  Procedure(s) (LRB): RIGHT TOTAL KNEE ARTHROPLASTY WITH REMOVAL OF HARDWARE (Right)  Attending Physician:  Dr. Durene Romans   Admission Diagnoses:   Right knee primary OA / pain with removal of hardware    Discharge Diagnoses:  Principal Problem:   S/P right TKA Active Problems:   Overweight (BMI 25.0-29.9)  Past Medical History:  Diagnosis Date  . Anxiety unk  . Arthritis   . Hypertension    denies   . Seizures (HCC)    last seizures 12 years ago     HPI:    Sean Berry, 56 y.o. male, has a history of pain and functional disability in the right knee due to arthritis and retained hardware and has failed non-surgical conservative treatments for greater than 12 weeks to include NSAID's and/or analgesics, corticosteriod injections and activity modification.  Onset of symptoms was gradual, starting >10 years ago with gradually worsening course since that time. The patient noted prior procedures on the knee to include  arthroscopy and ACL reconstruction on the right knee(s).  Patient currently rates pain in the right knee(s) at 10 out of 10 with activity. Patient has night pain, worsening of pain with activity and weight bearing, pain that interferes with activities of daily living, pain with passive range of motion, crepitus and joint swelling.  Patient has evidence of periarticular osteophytes and joint space narrowing by imaging studies.  There is no active infection.   Risks, benefits and expectations were discussed with the patient.  Risks including but not limited to the risk of anesthesia, blood clots, nerve damage, blood vessel damage, failure of the prosthesis, infection and up to and including death.  Patient understand the risks, benefits and expectations and wishes to proceed with surgery.   PCP: Barbie Banner, MD   Discharged Condition: good  Hospital Course:  Patient underwent the above stated procedure on 08/25/2017. Patient tolerated the procedure well and brought to the recovery room in good condition and subsequently to the floor.  POD #1 BP: 106/77 ; Pulse: 74 ; Temp: 98.2 F (36.8 C) ; Resp: 16 Patient reports pain as mild, pain controlled. Patient stated he had a long night and didn't sleep well because of all of the alarms and noises.  No events otherwise. Plan on discharge today after 2 PT sessions. Dorsiflexion/plantar flexion intact, incision: dressing C/D/I, no cellulitis present and compartment soft.   LABS  Basename    HGB     11.4  HCT     33.4    Discharge Exam: General appearance: alert, cooperative and no distress Extremities: Homans sign is negative, no sign of DVT, no edema, redness or tenderness in the calves or thighs and no ulcers, gangrene or trophic changes  Disposition: Home with follow up in 2 weeks   Follow-up Information    Durene Romans, MD. Schedule an appointment as soon as possible for a visit in 2 week(s).   Specialty:  Orthopedic Surgery Contact information: [address] [place] [address] [phone]           Discharge Instructions    Call MD / Call 911   Complete by:  As directed    If you experience chest pain or shortness of breath, CALL 911 and be transported to the hospital emergency room.  If you develope a fever above  101 F, pus (white drainage) or increased drainage or redness at the wound, or calf pain, call your surgeon's office.   Change dressing   Complete by:  As directed    Maintain surgical dressing until follow up in the clinic. If the edges start to pull up, may reinforce with tape. If the dressing is no longer working, may remove and cover with gauze and tape, but must keep the area dry and clean.  Call with any questions or concerns.   Constipation Prevention   Complete by:  As directed     Drink plenty of fluids.  Prune juice may be helpful.  You may use a stool softener, such as Colace (over the counter) 100 mg twice a day.  Use MiraLax (over the counter) for constipation as needed.   Diet - low sodium heart healthy   Complete by:  As directed    Discharge instructions   Complete by:  As directed    Maintain surgical dressing until follow up in the clinic. If the edges start to pull up, may reinforce with tape. If the dressing is no longer working, may remove and cover with gauze and tape, but must keep the area dry and clean.  [facility] Call with any questions or concerns.   Increase activity slowly as tolerated   Complete by:  As directed    Weight bearing as tolerated with assist device (walker, cane, etc) as directed, use it as long as suggested by your surgeon or therapist, typically at least 4-6 weeks.   TED hose   Complete by:  As directed    Use stockings (TED hose) for 2 weeks on both leg(s).  You may remove them at night for sleeping.      Allergies as of 08/26/2017      Reactions   Wellbutrin [bupropion]    Seizures       Medication List    STOP taking these medications   cephALEXin 500 MG capsule Commonly known as:  KEFLEX     TAKE these medications   ALPRAZolam 1 MG tablet Commonly known as:  XANAX Take 1 mg by mouth at bedtime as needed for anxiety.   aspirin 81 MG chewable tablet Commonly known as:  ASPIRIN CHILDRENS Chew 1 tablet (81 mg total) 2 (two) times daily by mouth.   docusate sodium 100 MG capsule Commonly known as:  COLACE Take 1 capsule (100 mg total) 2 (two) times daily by mouth.   esomeprazole 40 MG capsule Commonly known as:  NEXIUM Take 40 mg by mouth daily.   ferrous sulfate 325 (65 FE) MG tablet Commonly known as:  FERROUSUL Take 1 tablet (325 mg total) 3 (three) times daily with meals by mouth.   methocarbamol 500 MG tablet Commonly known as:  ROBAXIN Take 1 tablet (500 mg total)  every 6 (six) hours as needed by mouth for muscle spasms.   Oxycodone HCl 20 MG Tabs TAKE 1 TABLET BY MOUTH UP TO 4 TIMES A DAY AS NEEDED FOR PAIN   phenytoin 100 MG ER capsule Commonly known as:  DILANTIN Take 500 mg by mouth daily.   polyethylene glycol packet Commonly known as:  MIRALAX / GLYCOLAX Take 17 g 2 (two) times daily by mouth.   tadalafil 20 MG tablet Commonly known as:  CIALIS TAKE 1/2 OF A TABLET BY MOUTH AS NEEDED FOR E.D.            Discharge Care  Instructions  (From admission, onward)        Start     Ordered   08/26/17 0000  Change dressing    Comments:  Maintain surgical dressing until follow up in the clinic. If the edges start to pull up, may reinforce with tape. If the dressing is no longer working, may remove and cover with gauze and tape, but must keep the area dry and clean.  Call with any questions or concerns.   08/26/17 [id number]       Signed: Anastasio AuerbachMatthew S. Jodye Scali   PA-C  09/01/2017, 9:00 PM

## 2017-09-21 ENCOUNTER — Ambulatory Visit (HOSPITAL_COMMUNITY): Payer: Managed Care, Other (non HMO)

## 2017-09-21 ENCOUNTER — Other Ambulatory Visit (HOSPITAL_COMMUNITY): Payer: Self-pay | Admitting: Orthopedic Surgery

## 2017-09-21 DIAGNOSIS — M79604 Pain in right leg: Secondary | ICD-10-CM

## 2017-09-21 DIAGNOSIS — M7989 Other specified soft tissue disorders: Principal | ICD-10-CM

## 2017-10-08 NOTE — Progress Notes (Signed)
NEED ORDERS IN Epic FOR 12-31- SURGERY PRE OP IS 12-27

## 2017-10-15 ENCOUNTER — Encounter (HOSPITAL_COMMUNITY): Admission: RE | Admit: 2017-10-15 | Payer: Managed Care, Other (non HMO) | Source: Ambulatory Visit

## 2017-10-19 ENCOUNTER — Ambulatory Visit (HOSPITAL_COMMUNITY)
Admission: RE | Admit: 2017-10-19 | Payer: Managed Care, Other (non HMO) | Source: Ambulatory Visit | Admitting: Orthopedic Surgery

## 2017-10-19 ENCOUNTER — Encounter (HOSPITAL_COMMUNITY): Admission: RE | Payer: Self-pay | Source: Ambulatory Visit

## 2017-10-19 SURGERY — MANIPULATION, KNEE, CLOSED
Anesthesia: Spinal | Laterality: Right

## 2018-03-11 ENCOUNTER — Other Ambulatory Visit
Admission: RE | Admit: 2018-03-11 | Discharge: 2018-03-11 | Disposition: A | Discharge status: Home / Self Care | Payer: Worker's Compensation | Source: Other Acute Inpatient Hospital | Attending: Family Medicine | Admitting: Family Medicine

## 2018-03-12 NOTE — ED Notes (Addendum)
Pt UDS workmans comp done, and walked to lab.  Pt identified by employer representitive.  Pt was not seen by ED physician
